# Patient Record
Sex: Male | Born: 1965 | Race: Black or African American | Hispanic: No | Marital: Married | Smoking: Never smoker
Health system: Southern US, Community
[De-identification: ages and names within clinical notes are randomized; demographics above are authoritative.]

## PROBLEM LIST (undated history)

## (undated) DIAGNOSIS — I10 Essential (primary) hypertension: Secondary | ICD-10-CM

## (undated) DIAGNOSIS — I82409 Acute embolism and thrombosis of unspecified deep veins of unspecified lower extremity: Secondary | ICD-10-CM

## (undated) HISTORY — PX: ANKLE SURGERY: SHX546

## (undated) HISTORY — PX: ORIF PROXIMAL TIBIOFIBULAR JOINT: SUR954

## (undated) HISTORY — DX: Essential (primary) hypertension: I10

---

## 1988-08-22 DIAGNOSIS — K259 Gastric ulcer, unspecified as acute or chronic, without hemorrhage or perforation: Secondary | ICD-10-CM

## 1988-08-22 HISTORY — DX: Gastric ulcer, unspecified as acute or chronic, without hemorrhage or perforation: K25.9

## 1997-08-22 HISTORY — PX: OTHER SURGICAL HISTORY: SHX169

## 2004-10-28 ENCOUNTER — Inpatient Hospital Stay: Payer: Self-pay | Admitting: Surgery

## 2006-02-13 ENCOUNTER — Emergency Department: Payer: Self-pay | Admitting: Emergency Medicine

## 2006-02-17 ENCOUNTER — Emergency Department: Payer: Self-pay | Admitting: Emergency Medicine

## 2006-02-20 ENCOUNTER — Emergency Department: Payer: Self-pay | Admitting: Emergency Medicine

## 2006-03-01 ENCOUNTER — Ambulatory Visit: Payer: Self-pay | Admitting: Gastroenterology

## 2008-01-22 ENCOUNTER — Other Ambulatory Visit: Payer: Self-pay

## 2008-01-22 ENCOUNTER — Emergency Department: Payer: Self-pay | Admitting: Emergency Medicine

## 2008-04-24 ENCOUNTER — Emergency Department: Payer: Self-pay | Admitting: Emergency Medicine

## 2008-05-27 ENCOUNTER — Emergency Department: Payer: Self-pay | Admitting: Emergency Medicine

## 2009-02-20 ENCOUNTER — Emergency Department: Payer: Self-pay | Admitting: Emergency Medicine

## 2009-03-30 ENCOUNTER — Emergency Department: Payer: Self-pay | Admitting: Emergency Medicine

## 2009-08-23 ENCOUNTER — Emergency Department: Payer: Self-pay | Admitting: Emergency Medicine
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
## 2009-09-06 IMAGING — CR DG KNEE 1-2V*L*
1 series · 2 of 2 positions shown · non-contrast
Comparison: none

REASON FOR EXAM: painful
COMMENTS:

PROCEDURE:     DXR - DXR KNEE LEFT AP AND LATERAL  - March 30, 2009 [DATE]
RESULT:     There is an essentially nondisplaced oblique fracture of the
proximal diaphysis of the left fibula. No other fractures are seen. The knee
joint space is well maintained. The patella is intact.

[Series 1: view not recorded · 0.17mm/px · 2 of 2 slices shown]
[im 1/2]
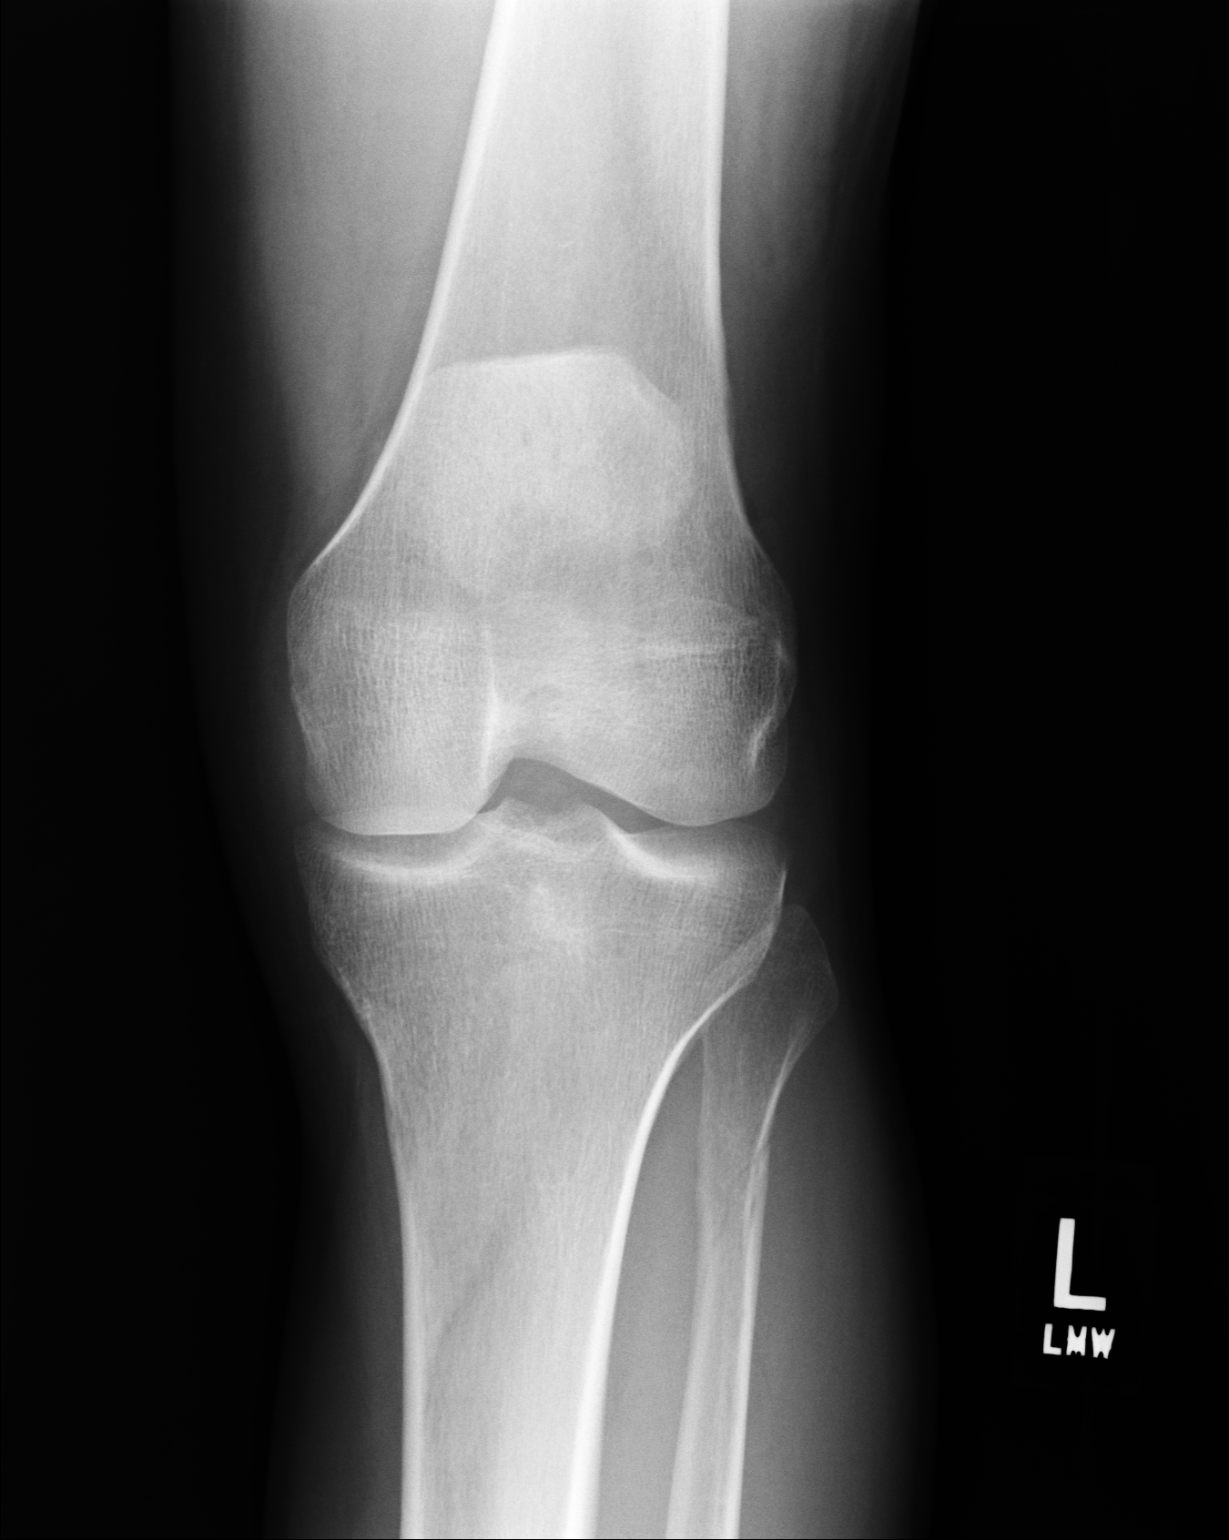
[im 2/2]
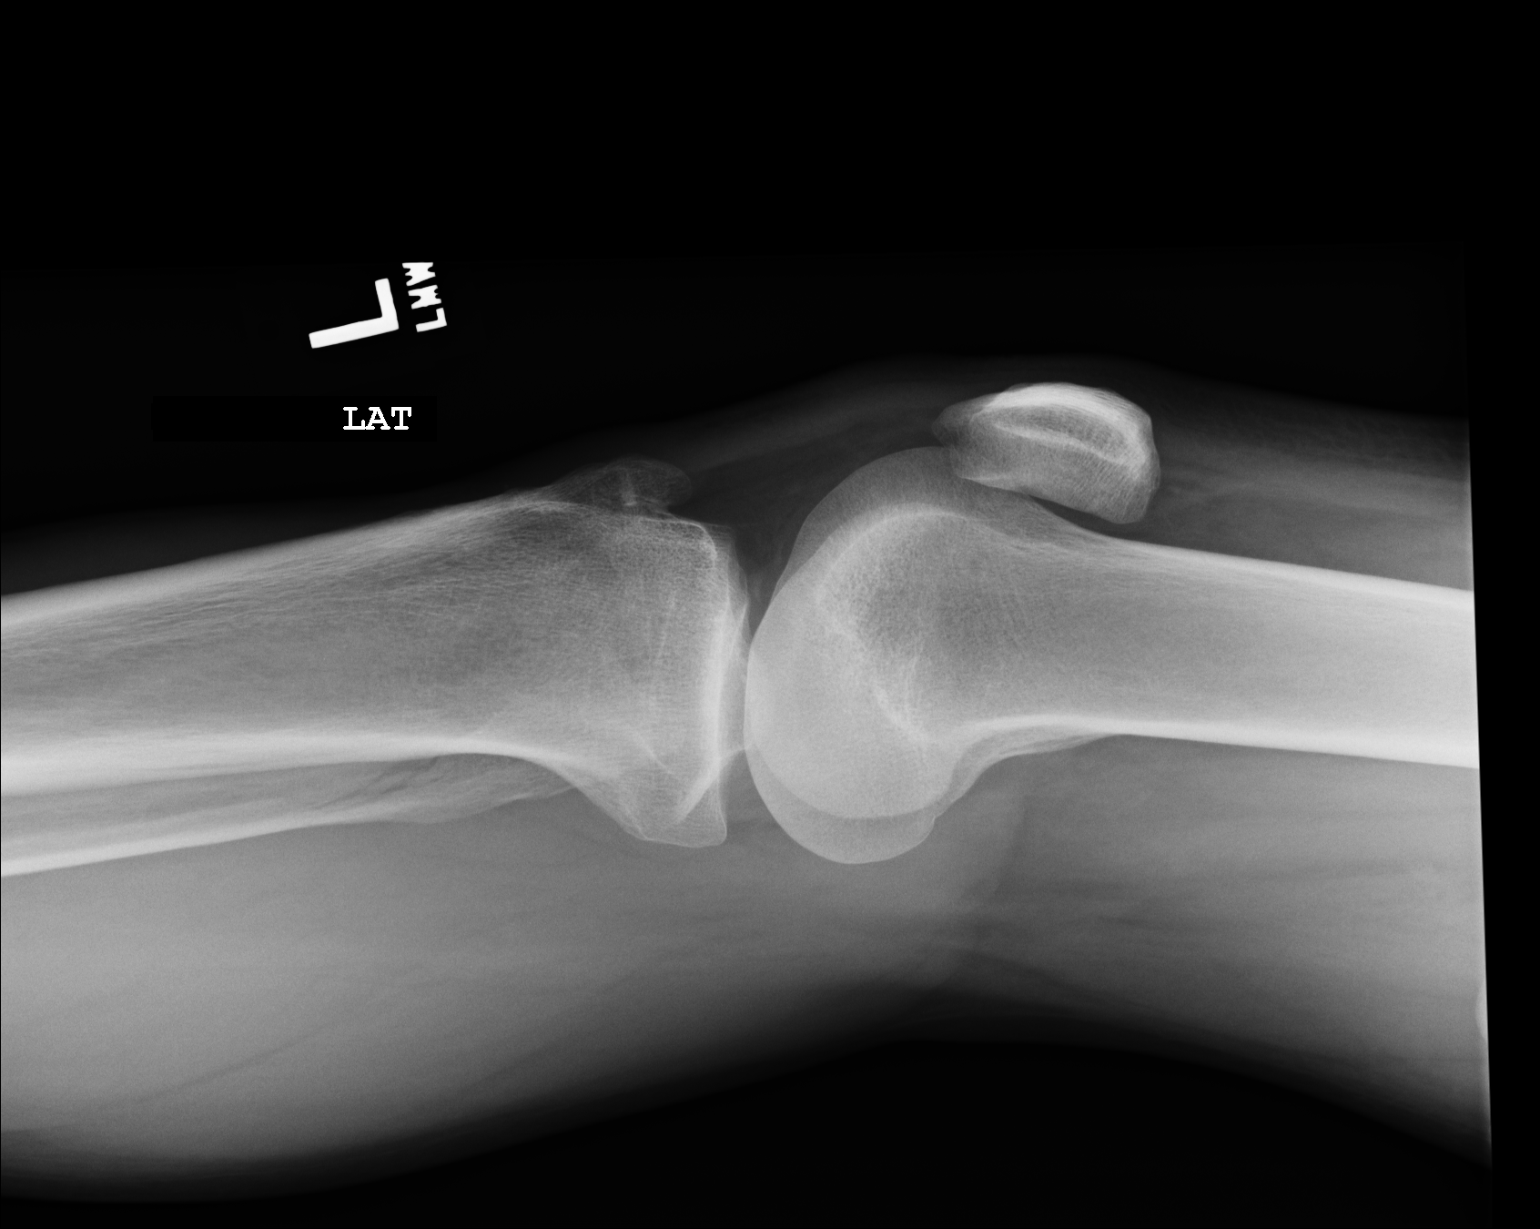

[2 of 2 positions shown; findings below may reference images not displayed]

IMPRESSION: 1.     Fracture of the proximal left fibula, essentially nondisplaced.

## 2009-10-18 ENCOUNTER — Emergency Department: Payer: Self-pay | Admitting: Emergency Medicine

## 2010-03-07 ENCOUNTER — Emergency Department: Payer: Self-pay | Admitting: Emergency Medicine

## 2010-03-10 ENCOUNTER — Inpatient Hospital Stay: Payer: Self-pay | Admitting: Surgery

## 2010-04-05 ENCOUNTER — Other Ambulatory Visit: Payer: Self-pay | Admitting: Specialist

## 2010-04-13 ENCOUNTER — Encounter: Payer: Self-pay | Admitting: Specialist

## 2010-04-22 ENCOUNTER — Encounter: Payer: Self-pay | Admitting: Specialist

## 2010-05-22 ENCOUNTER — Encounter: Payer: Self-pay | Admitting: Specialist

## 2010-06-12 ENCOUNTER — Emergency Department: Payer: Self-pay | Admitting: Internal Medicine

## 2010-06-22 ENCOUNTER — Encounter: Payer: Self-pay | Admitting: Specialist

## 2011-06-04 ENCOUNTER — Emergency Department: Payer: Self-pay | Admitting: Unknown Physician Specialty

## 2011-06-12 ENCOUNTER — Emergency Department: Payer: Self-pay | Admitting: Unknown Physician Specialty

## 2011-09-13 ENCOUNTER — Emergency Department: Payer: Self-pay | Admitting: Emergency Medicine

## 2011-09-13 LAB — BASIC METABOLIC PANEL
Co2: 28 mmol/L (ref 21–32)
Creatinine: 0.88 mg/dL (ref 0.60–1.30)
EGFR (African American): >60
EGFR (Non-African Amer.): >60
Glucose: 84 mg/dL (ref 65–99)
Potassium: 3.7 mmol/L (ref 3.5–5.1)
Sodium: 144 mmol/L (ref 136–145)

## 2011-09-13 LAB — CBC
HCT: 39.1 % — ABNORMAL LOW (ref 40.0–52.0)
HGB: 13.1 g/dL (ref 13.0–18.0)
MCHC: 33.6 g/dL (ref 32.0–36.0)
MCV: 102 fL — ABNORMAL HIGH (ref 80–100)
Platelet: 279 10*3/uL (ref 150–440)
RBC: 3.83 10*6/uL — ABNORMAL LOW (ref 4.40–5.90)
WBC: 8.7 10*3/uL (ref 3.8–10.6)

## 2011-09-13 LAB — TROPONIN I: Troponin-I: <0.02 ng/mL

## 2012-02-18 ENCOUNTER — Emergency Department: Payer: Self-pay | Admitting: *Deleted

## 2012-02-19 ENCOUNTER — Ambulatory Visit: Payer: Self-pay | Admitting: Orthopaedic Surgery

## 2012-02-19 LAB — CBC WITH DIFFERENTIAL/PLATELET
Basophil #: 0 10*3/uL (ref 0.0–0.1)
Eosinophil #: 0 10*3/uL (ref 0.0–0.7)
Eosinophil %: 0.2 %
HCT: 35.7 % — ABNORMAL LOW (ref 40.0–52.0)
Lymphocyte #: 1 10*3/uL (ref 1.0–3.6)
Lymphocyte %: 8.4 %
MCH: 33.6 pg (ref 26.0–34.0)
MCHC: 32.7 g/dL (ref 32.0–36.0)
MCV: 103 fL — ABNORMAL HIGH (ref 80–100)
Monocyte #: 0.9 x10 3/mm (ref 0.2–1.0)
Monocyte %: 7.3 %
Platelet: 277 10*3/uL (ref 150–440)
RBC: 3.48 10*6/uL — ABNORMAL LOW (ref 4.40–5.90)
RDW: 12.6 % (ref 11.5–14.5)
WBC: 12.4 10*3/uL — ABNORMAL HIGH (ref 3.8–10.6)

## 2012-02-19 LAB — COMPREHENSIVE METABOLIC PANEL
Anion Gap: 12 (ref 7–16)
Bilirubin,Total: 0.4 mg/dL (ref 0.2–1.0)
Calcium, Total: 9 mg/dL (ref 8.5–10.1)
Chloride: 102 mmol/L (ref 98–107)
Co2: 24 mmol/L (ref 21–32)
EGFR (African American): >60
EGFR (Non-African Amer.): >60
Potassium: 3.4 mmol/L — ABNORMAL LOW (ref 3.5–5.1)
SGOT(AST): 46 U/L — ABNORMAL HIGH (ref 15–37)
Total Protein: 7.9 g/dL (ref 6.4–8.2)

## 2012-04-22 ENCOUNTER — Encounter: Payer: Self-pay | Admitting: Orthopedic Surgery

## 2012-06-29 ENCOUNTER — Emergency Department: Payer: Self-pay | Admitting: *Deleted

## 2012-07-17 ENCOUNTER — Emergency Department: Payer: Self-pay | Admitting: Emergency Medicine

## 2012-09-20 ENCOUNTER — Encounter: Payer: Self-pay | Admitting: Orthopedic Surgery

## 2012-09-22 ENCOUNTER — Encounter: Payer: Self-pay | Admitting: Orthopedic Surgery

## 2012-10-06 LAB — BASIC METABOLIC PANEL
BUN: 6 mg/dL — ABNORMAL LOW (ref 7–18)
Calcium, Total: 9.5 mg/dL (ref 8.5–10.1)
Co2: 26 mmol/L (ref 21–32)
EGFR (African American): >60
Glucose: 102 mg/dL — ABNORMAL HIGH (ref 65–99)
Sodium: 138 mmol/L (ref 136–145)

## 2012-10-06 LAB — CBC
HCT: 37.1 % — ABNORMAL LOW (ref 40.0–52.0)
HGB: 11.6 g/dL — ABNORMAL LOW (ref 13.0–18.0)
MCH: 28 pg (ref 26.0–34.0)
MCHC: 31.3 g/dL — ABNORMAL LOW (ref 32.0–36.0)
MCV: 89 fL (ref 80–100)
Platelet: 286 10*3/uL (ref 150–440)
RBC: 4.16 10*6/uL — ABNORMAL LOW (ref 4.40–5.90)

## 2012-10-07 ENCOUNTER — Inpatient Hospital Stay: Payer: Self-pay | Admitting: Internal Medicine

## 2012-10-07 LAB — BASIC METABOLIC PANEL
Anion Gap: 9 (ref 7–16)
Calcium, Total: 8.8 mg/dL (ref 8.5–10.1)
Chloride: 104 mmol/L (ref 98–107)
Co2: 23 mmol/L (ref 21–32)
Creatinine: 0.89 mg/dL (ref 0.60–1.30)
EGFR (African American): >60
EGFR (Non-African Amer.): >60
Glucose: 95 mg/dL (ref 65–99)
Potassium: 3.6 mmol/L (ref 3.5–5.1)

## 2012-10-07 LAB — CBC WITH DIFFERENTIAL/PLATELET
Basophil #: 0.1 10*3/uL (ref 0.0–0.1)
Eosinophil %: 2.8 %
HCT: 32.9 % — ABNORMAL LOW (ref 40.0–52.0)
HGB: 10.5 g/dL — ABNORMAL LOW (ref 13.0–18.0)
MCH: 28.1 pg (ref 26.0–34.0)
MCHC: 32 g/dL (ref 32.0–36.0)
Monocyte %: 11.9 %
Neutrophil #: 6.6 10*3/uL — ABNORMAL HIGH (ref 1.4–6.5)
RBC: 3.74 10*6/uL — ABNORMAL LOW (ref 4.40–5.90)

## 2012-10-07 LAB — HEPATIC FUNCTION PANEL A (ARMC)
Albumin: 4.4 g/dL (ref 3.4–5.0)
Alkaline Phosphatase: 131 U/L (ref 50–136)
Bilirubin,Total: 0.4 mg/dL (ref 0.2–1.0)
SGOT(AST): 51 U/L — ABNORMAL HIGH (ref 15–37)
SGPT (ALT): 38 U/L (ref 12–78)
Total Protein: 7.7 g/dL (ref 6.4–8.2)

## 2012-10-07 LAB — PROTIME-INR: INR: 0.9

## 2012-10-08 LAB — CBC WITH DIFFERENTIAL/PLATELET
Basophil #: 0 10*3/uL (ref 0.0–0.1)
Basophil %: 0.7 %
Eosinophil #: 0.2 10*3/uL (ref 0.0–0.7)
HGB: 9.9 g/dL — ABNORMAL LOW (ref 13.0–18.0)
Lymphocyte %: 18.7 %
MCH: 28 pg (ref 26.0–34.0)
MCHC: 31.8 g/dL — ABNORMAL LOW (ref 32.0–36.0)
MCV: 88 fL (ref 80–100)
Monocyte #: 1.1 x10 3/mm — ABNORMAL HIGH (ref 0.2–1.0)
Monocyte %: 15.1 %
Neutrophil #: 4.4 10*3/uL (ref 1.4–6.5)
Neutrophil %: 62.3 %
Platelet: 224 10*3/uL (ref 150–440)
RBC: 3.55 10*6/uL — ABNORMAL LOW (ref 4.40–5.90)
RDW: 18.5 % — ABNORMAL HIGH (ref 11.5–14.5)

## 2012-10-09 LAB — CBC WITH DIFFERENTIAL/PLATELET
Eosinophil %: 2.7 %
HGB: 10.4 g/dL — ABNORMAL LOW (ref 13.0–18.0)
Lymphocyte %: 16 %
MCH: 28.6 pg (ref 26.0–34.0)
MCHC: 32.2 g/dL (ref 32.0–36.0)
MCV: 89 fL (ref 80–100)
Monocyte %: 14.9 %
Neutrophil #: 5.9 10*3/uL (ref 1.4–6.5)
Neutrophil %: 65.8 %
RDW: 18 % — ABNORMAL HIGH (ref 11.5–14.5)
WBC: 9 10*3/uL (ref 3.8–10.6)

## 2012-10-20 ENCOUNTER — Encounter: Payer: Self-pay | Admitting: Orthopedic Surgery

## 2012-11-20 ENCOUNTER — Encounter: Payer: Self-pay | Admitting: Orthopedic Surgery

## 2012-12-20 ENCOUNTER — Encounter: Payer: Self-pay | Admitting: Orthopedic Surgery

## 2013-06-03 ENCOUNTER — Emergency Department: Payer: Self-pay | Admitting: Emergency Medicine

## 2013-06-03 LAB — BASIC METABOLIC PANEL
BUN: 5 mg/dL — ABNORMAL LOW (ref 7–18)
Calcium, Total: 9.9 mg/dL (ref 8.5–10.1)
Co2: 26 mmol/L (ref 21–32)
Creatinine: 0.85 mg/dL (ref 0.60–1.30)
EGFR (African American): >60
Osmolality: 272 (ref 275–301)
Potassium: 4.1 mmol/L (ref 3.5–5.1)
Sodium: 138 mmol/L (ref 136–145)

## 2013-06-03 LAB — CBC
MCH: 32.6 pg (ref 26.0–34.0)
MCHC: 33.7 g/dL (ref 32.0–36.0)
MCV: 97 fL (ref 80–100)
Platelet: 252 10*3/uL (ref 150–440)
RBC: 3.95 10*6/uL — ABNORMAL LOW (ref 4.40–5.90)
WBC: 8.4 10*3/uL (ref 3.8–10.6)

## 2014-12-12 NOTE — Consult Note (Signed)
Chief Complaint:  Subjective/Chief Complaint No signs of active GI bleed. H and H stable. EGD with clean based ulcer.  Recommendations: May DC Octreotide in am if no signs of active bleeding. Probable DC in 1-2 days on PO PPI. Follow up with GI in 3-4 weeks.   Electronic Signatures: Lurline DelIftikhar, Latessa Tillis (MD)  (Signed [id number]-Feb-14 23:06)  Authored: Chief Complaint   Last Updated: 17-Feb-14 23:06 by Lurline DelIftikhar, Faruq Rosenberger (MD)

## 2014-12-12 NOTE — Consult Note (Signed)
PATIENT NAME:  Ricardo Goodman, Ricardo Goodman MR#:  [id number] DATE OF BIRTH:  28-Jun-1966  DATE OF CONSULTATION:  10/07/2012  REFERRING PHYSICIAN:   CONSULTING PHYSICIAN:  Lurline DelShaukat Marybell Robards, MD  REASON FOR CONSULTATION: Hematochezia, hematemesis.   HISTORY OF PRESENT ILLNESS: This is a 49 year old African American male with a history of ruptured peptic ulcer disease about 10 years ago according to him.  The patient presented to the Emergency Room on the day of admission with epigastric abdominal pain. According to him, he threw up blood at home 2 or 3 times and has been passing a small amount of bright red blood per rectum as well. On arrival, the patient was hemodynamically stable. Hemoglobin was about 11.6.  He denies melena. No other significant symptoms such as dizziness, weakness, syncope, presyncope, etc. was reported by the patient.   PAST MEDICAL HISTORY: Significant for history of ruptured peptic ulcer disease that required surgery.   ALLERGIES: None.   HOME MEDICATIONS:  Tramadol. He does take some ibuprofen every 8 hours as needed.   SOCIAL HISTORY: Drinks beer occasionally.  He smokes a few cigarettes a day.   REVIEW OF SYSTEMS:  Grossly negative except for what is mentioned in the History of Present Illness.   PHYSICAL EXAMINATION: GENERAL: Well built male who did not appear to be in any acute distress. No tachycardia or hypotension was noted.  NECK: Neck veins are flat.  LUNGS: Grossly clear to auscultation bilaterally with fair air entry and no added sounds.  CARDIOVASCULAR: Regular rate and rhythm. No gallops or murmur.  ABDOMEN: Examination showed moderate epigastric tenderness. No rebound or guarding was noted. No hepatosplenomegaly or ascites.  NEUROLOGIC: Appears to be unremarkable.   LABORATORY AND DIAGNOSTIC DATA:  Hemoglobin on admission was 11.6 which slowly drifted down to 10.4 in 48 hours or so.  PT and INR are within normal limits. Serum lipase is normal. Liver enzymes are  unremarkable as well as electrolytes, BUN and creatinine. CT scan of the abdomen and pelvis with contrast:  No significant abnormalities.   ASSESSMENT AND PLAN: The patient is with what appears to be upper gastrointestinal bleed as evidenced by hematemesis and epigastric pain. The patient has history of peptic ulcer disease in the past and has been using nonsteroidals, raising concerns about possible nonsteroidal-induced gastric mucosal damage. An upper gastrointestinal endoscopy was recommended and performed; full report is in the chart. The plan was discussed with admitting physician. The patient was treated with intravenous octreotide and pantoprazole initially, octreotide  was later discontinued and pantoprazole was switched to p.o. The patient will follow with us in a few weeks as an outpatient.  Biopsies were obtained during esophagogastroduodenoscopy for Helicobacter pylori and results were communicated with the patient once available.      ____________________________ Lurline DelShaukat Tymeir Weathington, MD si:ct D: 10/10/2012 09:00:47 ET T: 10/10/2012 09:23:28 ET JOB#: [id number]  cc: Lurline DelShaukat Brandt Chaney, MD, <Dictator> Lurline DelSHAUKAT Cannen Dupras MD ELECTRONICALLY SIGNED 10/18/2012 12:14

## 2014-12-12 NOTE — Consult Note (Signed)
Brief Consult Note: Diagnosis: Hematemesis and mlena/hematochezia.   Patient was seen by consultant.   Comments: Patient with 2 days h/o hematemesis as well as some rectal bleeding, not clear if the two are connected or the lower GI bleed is hemorrhoidal. H and H stable and he is hemodynamically stable.History of ruptured peptic ulcer disease many years ago.  Recommendations: Agree with current management. EGD. Follow H and H and transfuse as needed.  Electronic Signatures: Lurline DelIftikhar, Gao Mitnick (MD)  (Signed 16-Feb-14 10:43)  Authored: Brief Consult Note   Last Updated: 16-Feb-14 10:43 by Lurline DelIftikhar, Mieczyslaw Stamas (MD)

## 2014-12-12 NOTE — H&P (Signed)
PATIENT NAME:  Ricardo Goodman, Zaeem E MR#:  [id number] DATE OF BIRTH:  Jan 03, 1966  DATE OF ADMISSION:  10/07/2012  PRIMARY CARE PHYSICIAN:  Toy CookeyErnest Eason, MD  REFERRING PHYSICIAN:  Chiquita LothJade Sung, MD  CHIEF COMPLAINT:  Hematemesis, lower GI bleed and abdominal pain.   HISTORY OF PRESENT ILLNESS: The patient is a 49 year old African American male with a past medical history of peptic ulcer disease in the past, status post upper GI endoscopy and colonoscopy by Dr. Servando SnareWohl in the year 2007, status post peptic ulcer rupture approximately 10 years ago.  He is presenting to the ER with a chief complaint of epigastric abdominal pain. The patient had 3 episodes of hematemesis in the past 2 days and also he started having lower GI bleed. The patient is reporting of epigastric abdominal pain 8 to 9 out of 10. He has been using NSAIDs intermittently as needed for pain. The patient came into the ER after waiting for 2 days as his hematemesis is not improving. He admits that he sniffs cocaine. In the ER the patient had bright red blood bleed from the lower GI tract. The patient is not hypotensive or tachycardic.  A CT scan of the abdomen and pelvis is ordered, which is pending. His hemoglobin 11.6, hematocrit 37.1, platelet count 286.  He denies any chest pain or shortness of breath. No other complaints. No similar complaints in the recent past.  PAST MEDICAL HISTORY: Peptic ulcer disease.   PAST SURGICAL HISTORY: Left leg surgery for fractures of tibia and fibula.   ALLERGIES: The patient has no known drug allergies.   HOME MEDICATIONS: Tramadol as needed, Percocet 5/325 1 tablet p.o. q.6 hours as needed, ibuprofen 800 mg every 8 hours as needed.   PSYCHOSOCIAL HISTORY: Lives at home.  Drinks beer occasionally, smokes a few cigarettes per day and sniffs cocaine.   FAMILY HISTORY: Mother had a history of breast cancer.    REVIEW OF SYSTEMS:  CONSTITUTIONAL: Denies any fatigue, weakness, weight loss or weight gain.   EYES: Denies blurry vision, glaucoma, cataracts.  EARS, NOSE, THROAT: Denies epistaxis, discharge, snoring.  RESPIRATORY: No cough, no COPD or pneumonia.  CARDIOVASCULAR: Denies chest pain, palpitations, syncope.  GASTROINTESTINAL: Denies nausea, but vomiting blood, passing bright red blood per rectum. Complaining of epigastric abdominal pain. Denies jaundice.  Positive history of peptic ulcer disease.  GENITOURINARY: Denies any dysuria or hematuria.  ENDOCRINOLOGY: Denies polyuria, polyphagia or thyroid problems.  HEMATOLOGIC/LYMPHATIC:  Denies anemia or swollen glands.  INTEGUMENTARY: No acne, rash, lesions.  MUSCULOSKELETAL: No neck pain, back pain, shoulder pain. NEUROLOGIC:  Denies vertigo, ataxia, dementia.  PSYCHIATRIC: No history of insomnia, ADD, OCD.   PHYSICAL EXAMINATION: VITAL SIGNS: Temperature 98.1, pulse of 74, respirations 20, blood pressure 144/85, pulse oximetry 96% on room air.  GENERAL APPEARANCE: Not in acute distress, moderately built and moderately nourished, answering questions appropriately.  HEENT: Normocephalic, atraumatic. Pupils are equally reacting to light and accommodation. No scleral icterus. No postnasal drip. No sinus tenderness.  NECK: Supple. No JVD. No thyromegaly.  LUNGS: Clear to auscultation bilaterally. No accessory muscle usage. No anterior chest wall tenderness.  CARDIAC: S1, S2 normal. Regular rate and rhythm. No murmurs.  GASTROINTESTINAL: Soft, positive epigastric tenderness. No masses felt. No rebound tenderness. No CVA tenderness.  NEUROLOGIC: Awake, alert, oriented x 3. Motor and sensory are grossly intact. Reflexes are 2+.  SKIN: No lesions. No rashes, normal turgor, warm to touch.  EXTREMITIES: No edema. No cyanosis. No clubbing.  MUSCULOSKELETAL: No joint effusion,  tenderness or erythema.  RECTAL:  The patient deferred exam.   LABORATORY AND DIAGNOSTIC DATA:  CAT scan of the abdomen and pelvis is pending. Glucose 102, BUN 6,  creatinine 1.36, sodium 138, potassium 3.9, chloride 105, CO2 26, anion gap is 7, GFR greater than 60, serum osmolality 273, lipase 151, calcium 9.5.  LFTs are  within normal range except AST which is slightly elevated at 51. Troponin T less than 0.72. WBC 9.6, hemoglobin 11.6, hematocrit 37.1, platelet count 286. MCV 89. CT abdomen and pelvis is ordered, which is pending.   ASSESSMENT AND PLAN: A 49 year old male presenting to the Emergency Room with a 2-day history of hematemesis and lower gastrointestinal bleed with blood associated with epigastric abdominal pain.  He will be admitted with the following assessment and plan:  1.  Both upper and lower gastrointestinal bleed with epigastric abdominal pain. Plan:  We will keep him n.p.o. and provide him intravenous fluids. We will admit him to intensive care unit.  We will provide him with Protonix GTT.  We will start him on octreotide as recommended by gastroenterology doctor, Dr. Niel Hummer. We will check hemoglobin and hematocrit q.6 hours. Will check PT INRs. Consult is placed to gastroenterologist Dr. Niel Hummer. Pain management with morphine.  2.  History of peptic ulcer disease, status post ruptured ulcer.  Also the patient is taking non-steroidal anti-inflammatories 800 mg on an as needed basis for pain which could be the  etiology for the etiology of hematemesis, but we will have to wait on the  CAT scan of the abdomen and pelvis regarding the bright red blood per rectum. 3.  Cocaine use. The patient was counseled to stop taking. 4.  Acute kidney injury.  The patient is on intravenous fluids and we will monitor renal function closely.  5.  Nicotine use. The patient was counseled to quit smoking.  6.  Gastrointestinal prophylaxis with Protonix. and deep venous thrombosis prophylaxis with sequential compression devices only.  CODE STATUS:  Full code.  Critical care time spent:  60 minutes.   The diagnosis and plan of care was discussed in detail  with the patient.   He is aware of the plan.    ____________________________ Ramonita Lab, MD ag:ct D: 10/07/2012 01:22:43 ET T: 10/07/2012 09:02:48 ET JOB#: [id number]  cc: Ramonita Lab, MD, <Dictator> Serita Sheller. Maryellen Pile, MD Lurline Del, MD Ramonita Lab MD ELECTRONICALLY SIGNED 10/09/2012 6:34

## 2014-12-12 NOTE — Discharge Summary (Signed)
PATIENT NAME:  Ricardo Goodman, Ricardo Goodman MR#:  [id number] DATE OF BIRTH:  January 28, 1966  DATE OF ADMISSION:  10/07/2012 DATE OF DISCHARGE:  10/09/2012  FINAL DIAGNOSIS: Upper GI bleed, known bleeding gastric and duodenal ulcer, acute renal failure due to dehydration, improved.   PRIMARY GASTROENTEROLOGIST: Dr. Lurline DelShaukat Iftikhar.  PRIMARY CARE PHYSICIAN: Dr. Toy CookeyErnest Eason.   HISTORY OF PRESENT ILLNESS: The patient is a 49 year old African American male with the past medical history of peptic ulcer disease status post upper GI endoscopy and colonoscopy by Dr. Daleen SquibbWall in 2007, status post peptic ulcer rupture approximately 10 years ago, presented to the ER with chief complaint of epigastric abdominal pain, 3 episodes of hematemesis in the past 2 days and also had lower GI bleed. The patient is reporting of epigastric abdominal pain 8 to 9/10 and has been using NSAIDS immediately as needed for pain, came in the ER, after waiting for 2 days as his hematemsis is not improving. He admit that he sniffs cocaine. In the ER he had bright red blood from lower GI tract, but he was not hypotensive or tachycardic on presentation. CT scan of the abdomen and pelvis was ordered, and hemoglobin was 11.6.   ASSESSMENT AND PLAN:  Kept him n.p.o., provided him IV fluids and admitted to the Intensive Care Unit, was also started on Protonix drip and octreotide drip as recommended by  Dr. Niel HummerIftikhar. Hemoglobin was checked every 6 hours. Gastroenterology consult was done and the next day they did the endoscopy.   The findings were as follows: Normal esophagus, nonbleeding  gastropathy, one duodenal ulcer with clean base. After this gastroenterologist suggested to stop octreotide drip and start liquid diet slowly. The patient tolerated the diet well, and the next day he was feeling fine. His hemoglobin level remained stable in the hospital. He presented with 11.6, a drop down to 10.5 and then remained stable at that level. As he tolerated regular  diet, he was discharged home with advice to follow-up with gastroenterologist.     OTHER MEDICAL ISSUES ADDRESSED DURING THE HOSPITAL STAY:  1. History of peptic ulcer disease and ruptured ulcer. He was taking NSAIDS. He was advised to stop taking NSAIDS.  2. Tobacco use disorder and was advised to stop smoking.  3. Acute renal failure on presentation. His creatinine level was 1.36, which came down to 0.89 and so he was discharged home without any further intervention about that issue.  IMPORTANT LAB RESULTS: Creatinine 1.36 on admission came down to 0.89. Troponin less than 0.02. Hemoglobin on presentation 11.6, came down to 10.4 on discharge, remained stable at that level.   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 pylori seen, no dysplasia or malignancy.   CONDITION ON DISCHARGE: Stable.   CODE STATUS ON DISCHARGE: FULL CODE   MEDICATION ON DISCHARGE: MS Contin 50 mg every 12 hours tablet extended-release 2 times a day for 7 days, Protonix 40 mg delayed-release tablet 2 times a day, ferrous sulfate 325 mg oral tablet 3 times a day.   DIET ON DISCHARGE: Regular.   DIET CONSISTENCY: Regular.   DIET SPECIAL INSTRUCTIONS: Less spicy diet.   ACTIVITY: As tolerated.   TIMEFRAME TO FOLLOWUP: Within 1 to 2 weeks with GI Clinic with Dr. Lurline DelShaukat Iftikhar.   TOTAL TIME SPENT ON THIS DISCHARGE: 45 minutes.   ____________________________ Hope PigeonVaibhavkumar G. Elisabeth PigeonVachhani, MD vgv:jm D: 10/12/2012 20:33:00 ET T: 10/13/2012  13:07:05 ET JOB#: [id number]  cc: Hope Pigeon. Elisabeth Pigeon, MD, <Dictator> Lurline Del, MD Serita Sheller. Maryellen Pile, MD Altamese Dilling MD ELECTRONICALLY SIGNED 10/14/2012 23:19
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
## 2015-01-06 ENCOUNTER — Emergency Department
Admission: EM | Admit: 2015-01-06 | Discharge: 2015-01-06 | Disposition: A | Discharge status: Home / Self Care | Payer: No Typology Code available for payment source | Attending: Emergency Medicine | Admitting: Emergency Medicine

## 2015-01-06 DIAGNOSIS — S134XXA Sprain of ligaments of cervical spine, initial encounter: Secondary | ICD-10-CM | POA: Insufficient documentation

## 2015-01-06 DIAGNOSIS — S29012A Strain of muscle and tendon of back wall of thorax, initial encounter: Secondary | ICD-10-CM | POA: Diagnosis not present

## 2015-01-06 DIAGNOSIS — Y92410 Unspecified street and highway as the place of occurrence of the external cause: Secondary | ICD-10-CM | POA: Insufficient documentation

## 2015-01-06 DIAGNOSIS — Z79899 Other long term (current) drug therapy: Secondary | ICD-10-CM | POA: Diagnosis not present

## 2015-01-06 DIAGNOSIS — Z87891 Personal history of nicotine dependence: Secondary | ICD-10-CM | POA: Diagnosis not present

## 2015-01-06 DIAGNOSIS — Y9389 Activity, other specified: Secondary | ICD-10-CM | POA: Insufficient documentation

## 2015-01-06 DIAGNOSIS — S3992XA Unspecified injury of lower back, initial encounter: Secondary | ICD-10-CM | POA: Diagnosis present

## 2015-01-06 DIAGNOSIS — Y998 Other external cause status: Secondary | ICD-10-CM | POA: Diagnosis not present

## 2015-01-06 DIAGNOSIS — S8992XA Unspecified injury of left lower leg, initial encounter: Secondary | ICD-10-CM | POA: Insufficient documentation

## 2015-01-06 DIAGNOSIS — S39012A Strain of muscle, fascia and tendon of lower back, initial encounter: Secondary | ICD-10-CM

## 2015-01-06 MED ORDER — KETOROLAC TROMETHAMINE 10 MG PO TABS: 10.0000 mg | ORAL_TABLET | Freq: Three times a day (TID) | ORAL | Status: DC

## 2015-01-06 MED ORDER — TRAMADOL HCL 50 MG PO TABS
100.0000 mg | ORAL_TABLET | Freq: Once | ORAL | Status: AC
  Administered 2015-01-06: 100 mg via ORAL

## 2015-01-06 MED ORDER — CYCLOBENZAPRINE HCL 5 MG PO TABS: 5.0000 mg | ORAL_TABLET | Freq: Three times a day (TID) | ORAL | Status: DC | PRN

## 2015-01-06 MED ORDER — TRAMADOL HCL 50 MG PO TABS
ORAL_TABLET | ORAL | Status: AC
  Administered 2015-01-06: 100 mg via ORAL
  Filled 2015-01-06: qty 2
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
## 2015-01-12 ENCOUNTER — Encounter: Payer: Self-pay | Admitting: Emergency Medicine

## 2015-01-12 ENCOUNTER — Emergency Department
Admission: EM | Admit: 2015-01-12 | Discharge: 2015-01-12 | Disposition: A | Discharge status: Other Acute Inpatient Hospital | Payer: Medicaid Other | Attending: Emergency Medicine | Admitting: Emergency Medicine

## 2015-01-12 DIAGNOSIS — T2029XA Burn of second degree of multiple sites of head, face, and neck, initial encounter: Secondary | ICD-10-CM | POA: Insufficient documentation

## 2015-01-12 DIAGNOSIS — T22312A Burn of third degree of left forearm, initial encounter: Secondary | ICD-10-CM | POA: Insufficient documentation

## 2015-01-12 DIAGNOSIS — T22012A Burn of unspecified degree of left forearm, initial encounter: Secondary | ICD-10-CM | POA: Diagnosis present

## 2015-01-12 DIAGNOSIS — T23202A Burn of second degree of left hand, unspecified site, initial encounter: Secondary | ICD-10-CM | POA: Diagnosis not present

## 2015-01-12 DIAGNOSIS — Y9389 Activity, other specified: Secondary | ICD-10-CM | POA: Insufficient documentation

## 2015-01-12 DIAGNOSIS — Y998 Other external cause status: Secondary | ICD-10-CM | POA: Insufficient documentation

## 2015-01-12 DIAGNOSIS — T22111A Burn of first degree of right forearm, initial encounter: Secondary | ICD-10-CM | POA: Insufficient documentation

## 2015-01-12 DIAGNOSIS — T3110 Burns involving 10-19% of body surface with 0% to 9% third degree burns: Secondary | ICD-10-CM | POA: Diagnosis not present

## 2015-01-12 DIAGNOSIS — Z79899 Other long term (current) drug therapy: Secondary | ICD-10-CM | POA: Insufficient documentation

## 2015-01-12 DIAGNOSIS — X04XXXA Exposure to ignition of highly flammable material, initial encounter: Secondary | ICD-10-CM | POA: Insufficient documentation

## 2015-01-12 DIAGNOSIS — Y9289 Other specified places as the place of occurrence of the external cause: Secondary | ICD-10-CM | POA: Diagnosis not present

## 2015-01-12 DIAGNOSIS — T2024XA Burn of second degree of nose (septum), initial encounter: Secondary | ICD-10-CM | POA: Diagnosis not present

## 2015-01-12 LAB — CBC WITH DIFFERENTIAL/PLATELET
Basophils Absolute: 0.1 10*3/uL (ref 0–0.1)
Basophils Relative: 1 %
Eosinophils Absolute: 0.2 10*3/uL (ref 0–0.7)
Eosinophils Relative: 3 %
HCT: 32.4 % — ABNORMAL LOW (ref 40.0–52.0)
HEMOGLOBIN: 10.9 g/dL — AB (ref 13.0–18.0)
Lymphocytes Relative: 16 %
Lymphs Abs: 1.4 10*3/uL (ref 1.0–3.6)
MCH: 33.4 pg (ref 26.0–34.0)
MCHC: 33.5 g/dL (ref 32.0–36.0)
MCV: 99.6 fL (ref 80.0–100.0)
MONOS PCT: 15 %
Monocytes Absolute: 1.3 10*3/uL — ABNORMAL HIGH (ref 0.2–1.0)
NEUTROS PCT: 65 %
Neutro Abs: 5.6 10*3/uL (ref 1.4–6.5)
Platelets: 240 10*3/uL (ref 150–440)
RBC: 3.25 MIL/uL — ABNORMAL LOW (ref 4.40–5.90)
RDW: 13.4 % (ref 11.5–14.5)
WBC: 8.6 10*3/uL (ref 3.8–10.6)

## 2015-01-12 LAB — BASIC METABOLIC PANEL
ANION GAP: 11 (ref 5–15)
BUN: 10 mg/dL (ref 6–20)
CALCIUM: 9.7 mg/dL (ref 8.9–10.3)
CO2: 26 mmol/L (ref 22–32)
Chloride: 100 mmol/L — ABNORMAL LOW (ref 101–111)
Creatinine, Ser: 0.94 mg/dL (ref 0.61–1.24)
GFR calc Af Amer: >60 mL/min (ref >60)
Glucose, Bld: 79 mg/dL (ref 65–99)
Potassium: 3.6 mmol/L (ref 3.5–5.1)
Sodium: 137 mmol/L (ref 135–145)

## 2015-01-12 MED ORDER — HYDROMORPHONE HCL 1 MG/ML IJ SOLN
INTRAMUSCULAR | Status: AC
  Administered 2015-01-12: 1 mg via INTRAVENOUS
  Filled 2015-01-12: qty 1

## 2015-01-12 MED ORDER — TETANUS-DIPHTHERIA TOXOIDS TD 5-2 LFU IM INJ
INJECTION | INTRAMUSCULAR | Status: AC
  Administered 2015-01-12: 0.5 mL via INTRAMUSCULAR
  Filled 2015-01-12: qty 0.5

## 2015-01-12 MED ORDER — TETANUS-DIPHTHERIA TOXOIDS TD 5-2 LFU IM INJ
0.5000 mL | INJECTION | Freq: Once | INTRAMUSCULAR | Status: AC
  Administered 2015-01-12: 0.5 mL via INTRAMUSCULAR

## 2015-01-12 MED ORDER — HYDROMORPHONE HCL 1 MG/ML IJ SOLN
1.0000 mg | Freq: Once | INTRAMUSCULAR | Status: AC
  Administered 2015-01-12: 1 mg via INTRAVENOUS

## 2015-01-12 MED ORDER — LACTATED RINGERS IV BOLUS (SEPSIS)
1000.0000 mL | Freq: Once | INTRAVENOUS | Status: AC
  Administered 2015-01-12: 1000 mL via INTRAVENOUS
  Filled 2015-01-12: qty 1000
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
## 2015-01-12 NOTE — ED Notes (Signed)
[facility] Clifton CustardAaron
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
## 2017-07-18 ENCOUNTER — Encounter (HOSPITAL_COMMUNITY): Payer: Self-pay | Admitting: *Deleted

## 2017-07-18 ENCOUNTER — Emergency Department (HOSPITAL_COMMUNITY)

## 2017-07-18 ENCOUNTER — Emergency Department (HOSPITAL_COMMUNITY)
Admission: EM | Admit: 2017-07-18 | Discharge: 2017-07-18 | Disposition: A | Discharge status: Home / Self Care | Attending: Emergency Medicine | Admitting: Emergency Medicine

## 2017-07-18 DIAGNOSIS — Z79899 Other long term (current) drug therapy: Secondary | ICD-10-CM | POA: Diagnosis not present

## 2017-07-18 DIAGNOSIS — W1789XA Other fall from one level to another, initial encounter: Secondary | ICD-10-CM | POA: Diagnosis not present

## 2017-07-18 DIAGNOSIS — M25562 Pain in left knee: Secondary | ICD-10-CM | POA: Diagnosis present

## 2017-07-18 DIAGNOSIS — M25572 Pain in left ankle and joints of left foot: Secondary | ICD-10-CM

## 2017-07-18 HISTORY — DX: Acute embolism and thrombosis of unspecified deep veins of unspecified lower extremity: I82.409

## 2017-07-18 MED ORDER — NAPROXEN 500 MG PO TABS: 500.0000 mg | ORAL_TABLET | Freq: Two times a day (BID) | ORAL | 0 refills | Status: AC

## 2017-07-18 MED ORDER — KETOROLAC TROMETHAMINE 30 MG/ML IJ SOLN
15.0000 mg | Freq: Once | INTRAMUSCULAR | Status: AC
  Administered 2017-07-18: 15 mg via INTRAMUSCULAR
  Filled 2017-07-18: qty 1

## 2017-07-18 MED ORDER — NAPROXEN 250 MG PO TABS
500.0000 mg | ORAL_TABLET | Freq: Once | ORAL | Status: AC
  Administered 2017-07-18: 500 mg via ORAL
  Filled 2017-07-18: qty 2

## 2017-07-18 NOTE — ED Provider Notes (Signed)
[facility] EMERGENCY DEPARTMENT Provider Note   CSN: [id number] Arrival date & time: 07/18/17  2027     History   Chief Complaint Chief Complaint  Patient presents with  . Knee Pain    HPI Rona RavensJames E XXXMitchell is a 51 y.o. male presenting with left knee and ankle pain.  Patient states that just prior to arrival, he jumped from the top bunk onto the floor.  He felt a pop, and had acute onset left knee and ankle pain.  He states he has rods in his lower leg due to a previous injury.  He has not had anything for pain.  He states he is unable to ambulate due to pain.  He denies numbness or tingling.  Ambulation, palpation, and movement makes pain worse, nothing makes it better.  He denies injury elsewhere.  He did not hit his head or lose consciousness.  He is not on blood thinners.  HPI  Past Medical History:  Diagnosis Date  . DVT (deep venous thrombosis) (HCC)     There are no active problems to display for this patient.   Past Surgical History:  Procedure Laterality Date  . ANKLE SURGERY    . ORIF PROXIMAL TIBIOFIBULAR JOINT         Home Medications    Prior to Admission medications   Medication Sig Start Date End Date Taking? Authorizing Provider  cyclobenzaprine (FLEXERIL) 5 MG tablet Take 1 tablet (5 mg total) by mouth 3 (three) times daily as needed for muscle spasms. 01/06/15   Menshew, Charlesetta IvoryJenise V Bacon, PA-C  gabapentin (NEURONTIN) 300 MG capsule Take 300 mg by mouth 3 (three) times daily.    [provider]  ibuprofen (ADVIL,MOTRIN) 100 MG/5ML suspension Take 200 mg by mouth every 4 (four) hours as needed for mild pain.     [provider]  ketorolac (TORADOL) 10 MG tablet Take 1 tablet (10 mg total) by mouth every 8 (eight) hours. 01/06/15   Menshew, Charlesetta IvoryJenise V Bacon, PA-C  naproxen (NAPROSYN) 500 MG tablet Take 1 tablet (500 mg total) by mouth 2 (two) times daily with a meal for 10 days. 07/18/17 07/28/17  Justyna Timoney, PA-C    Family  History History reviewed. No pertinent family history.  Social History Social History   Tobacco Use  . Smoking status: Never Smoker  . Smokeless tobacco: Never Used  Substance Use Topics  . Alcohol use: Yes  . Drug use: No     Allergies   Patient has no known allergies.   Review of Systems Review of Systems  Musculoskeletal: Positive for arthralgias.  Neurological: Negative for numbness.  Hematological: Does not bruise/bleed easily.     Physical Exam Updated Vital Signs BP 122/83 (BP Location: Right Arm)   Pulse 78   Temp 98.2 F (36.8 C) (Oral)   Resp 20   Ht 5\' 8"  (1.727 m)   Wt 81.6 kg (180 lb)   SpO2 100%   BMI 27.37 kg/m   Physical Exam  Constitutional: He is oriented to person, place, and time. He appears well-developed and well-nourished. No distress.  HENT:  Head: Normocephalic and atraumatic.  Eyes: EOM are normal.  Neck: Normal range of motion.  Pulmonary/Chest: Effort normal.  Abdominal: He exhibits no distension.  Musculoskeletal: He exhibits tenderness.  No obvious swelling of the left knee.  Tenderness palpation of entire knee.  No obvious deformity.  Pain with active and passive range of motion.  No obvious swelling of the left ankle.  Tenderness palpation medial and lateral malleoli and surrounding ligaments.  Pedal pulses intact bilaterally.  Color and warmth of feet equal bilaterally.  Sensation intact bilaterally.  Soft compartments.  No tenderness to palpation of the tibia or fibula.   Neurological: He is alert and oriented to person, place, and time. No sensory deficit.  Skin: Skin is warm. No rash noted.  Psychiatric: He has a normal mood and affect.  Nursing note and vitals reviewed.    ED Treatments / Results  Labs (all labs ordered are listed, but only abnormal results are displayed) Labs Reviewed - No data to display  EKG  EKG Interpretation None       Radiology Dg Ankle Complete Left  Result Date: 07/18/2017 CLINICAL  DATA:  51 y/o M; jumped with popping sound and inability to bear weight. EXAM: LEFT ANKLE COMPLETE - 3+ VIEW COMPARISON:  None. FINDINGS: Screw fixation of medial malleolus and plate and screw fixation of the lateral malleolus. Talar dome is intact. Ankle mortise is symmetric on these nonstress views. No acute fracture identified. IMPRESSION: Bimalleolar surgical fixation, no apparent hardware related complication identified. No acute fracture or dislocation. Electronically Signed   By: Mitzi Hansen M.D.   On: 07/18/2017 21:27   Dg Knee Complete 4 Views Left  Result Date: 07/18/2017 CLINICAL DATA:  Left knee pain.  Unable to bear weight. EXAM: LEFT KNEE - COMPLETE 4+ VIEW COMPARISON:  None. FINDINGS: Extensive surgical hardware at the tibial plateau and proximal tibia with plate and screw fixation. Negative for an acute fracture or dislocation. No significant joint effusion. IMPRESSION: Extensive postsurgical changes involving the proximal left tibia. No acute bone abnormality to the left knee. Electronically Signed   By: Richarda Overlie M.D.   On: 07/18/2017 21:23    Procedures Procedures (including critical care time)  Medications Ordered in ED Medications  ketorolac (TORADOL) 30 MG/ML injection 15 mg (15 mg Intramuscular Given 07/18/17 2123)  naproxen (NAPROSYN) tablet 500 mg (500 mg Oral Given 07/18/17 2201)     Initial Impression / Assessment and Plan / ED Course  I have reviewed the triage vital signs and the nursing notes.  Pertinent labs & imaging results that were available during my care of the patient were reviewed by me and considered in my medical decision making (see chart for details).     Patient presenting for evaluation of left ankle and knee pain after jumping off the top bunk.  He is neurovascularly intact.  No obvious injuries elsewhere.  X-rays negative for fracture or dislocation.  Likely muscular ligamentous injuries.  Toradol given for pain.  Patient reports  improvement of pain with medicine.  As patient is going back to jail, there are restrictions as what he can wear.  Will give neoprene sleeve and ASO brace without the laces for tonight.  Patient to be transferred to a different facility tomorrow where he can wear a knee immobilizer.  If symptoms are not improving in 7 days, patient follow-up with orthopedics.  NSAIDs for pain control.  Discussed RICE treatment.  At this time, patient appears safe for discharge.  Return precautions given.  Patient states he understands and agrees to plan.   Final Clinical Impressions(s) / ED Diagnoses   Final diagnoses:  Acute pain of left knee  Acute left ankle pain    ED Discharge Orders        Ordered    naproxen (NAPROSYN) 500 MG tablet  2 times daily with meals  07/18/17 2148       Alveria ApleyCaccavale, Jedadiah Abdallah, PA-C 07/19/17 0145    Donnetta Hutchingook, Brian, MD 07/21/17 2106

## 2017-07-18 NOTE — Discharge Instructions (Signed)
There was no new bony damage, fracture, or dislocation noted on your x-rays.  You likely have muscular or ligamentous injury of your knee and ankle.  This will often heal on its own with time. Take naproxen twice a day with meals for the next 10 days. Try to rest your knee and ankle as much as possible.  Elevate it when it is possible. Wear the knee sleeve tonight.  If you are able to be transferred, you may wear the knee immobilizer.  You should wear this at all times except while showering.  This will help support your knee. If your pain is not improving in 1 week, follow-up with the orthopedic doctor. Return to the emergency room if you develop numbness, color change of your foot, or any new or worsening symptoms.

## 2017-07-18 NOTE — ED Triage Notes (Signed)
Pt jumped from top bunk bed and heard a popping sound landed on floor. C/o left knee and left ankle, unable to put weight on foot.

## 2017-07-18 NOTE — ED Notes (Signed)
Pt to xray

## 2022-10-18 DIAGNOSIS — F3181 Bipolar II disorder: Secondary | ICD-10-CM | POA: Diagnosis not present

## 2022-12-31 DIAGNOSIS — Z6831 Body mass index (BMI) 31.0-31.9, adult: Secondary | ICD-10-CM | POA: Diagnosis not present

## 2022-12-31 DIAGNOSIS — K219 Gastro-esophageal reflux disease without esophagitis: Secondary | ICD-10-CM | POA: Diagnosis not present

## 2022-12-31 DIAGNOSIS — F419 Anxiety disorder, unspecified: Secondary | ICD-10-CM | POA: Diagnosis not present

## 2022-12-31 DIAGNOSIS — E785 Hyperlipidemia, unspecified: Secondary | ICD-10-CM | POA: Diagnosis not present

## 2022-12-31 DIAGNOSIS — Z809 Family history of malignant neoplasm, unspecified: Secondary | ICD-10-CM | POA: Diagnosis not present

## 2022-12-31 DIAGNOSIS — E669 Obesity, unspecified: Secondary | ICD-10-CM | POA: Diagnosis not present

## 2022-12-31 DIAGNOSIS — Z87891 Personal history of nicotine dependence: Secondary | ICD-10-CM | POA: Diagnosis not present

## 2022-12-31 DIAGNOSIS — I10 Essential (primary) hypertension: Secondary | ICD-10-CM | POA: Diagnosis not present

## 2022-12-31 DIAGNOSIS — M1990 Unspecified osteoarthritis, unspecified site: Secondary | ICD-10-CM | POA: Diagnosis not present

## 2022-12-31 DIAGNOSIS — F329 Major depressive disorder, single episode, unspecified: Secondary | ICD-10-CM | POA: Diagnosis not present

## 2023-01-28 ENCOUNTER — Emergency Department: Payer: Medicaid Other

## 2023-01-28 ENCOUNTER — Encounter: Payer: Self-pay | Admitting: Emergency Medicine

## 2023-01-28 ENCOUNTER — Other Ambulatory Visit: Payer: Self-pay

## 2023-01-28 DIAGNOSIS — K625 Hemorrhage of anus and rectum: Secondary | ICD-10-CM | POA: Insufficient documentation

## 2023-01-28 DIAGNOSIS — I10 Essential (primary) hypertension: Secondary | ICD-10-CM | POA: Diagnosis not present

## 2023-01-28 DIAGNOSIS — R0789 Other chest pain: Secondary | ICD-10-CM | POA: Diagnosis not present

## 2023-01-28 DIAGNOSIS — R079 Chest pain, unspecified: Secondary | ICD-10-CM | POA: Diagnosis present

## 2023-01-28 LAB — COMPREHENSIVE METABOLIC PANEL
ALT: 61 U/L — ABNORMAL HIGH (ref 0–44)
AST: 93 U/L — ABNORMAL HIGH (ref 15–41)
Albumin: 4.6 g/dL (ref 3.5–5.0)
Alkaline Phosphatase: 85 U/L (ref 38–126)
Anion gap: 13 (ref 5–15)
BUN: 10 mg/dL (ref 6–20)
CO2: 18 mmol/L — ABNORMAL LOW (ref 22–32)
Calcium: 9.5 mg/dL (ref 8.9–10.3)
Chloride: 110 mmol/L (ref 98–111)
Creatinine, Ser: 1.01 mg/dL (ref 0.61–1.24)
GFR, Estimated: >60 mL/min (ref >60)
Glucose, Bld: 139 mg/dL — ABNORMAL HIGH (ref 70–99)
Potassium: 3.6 mmol/L (ref 3.5–5.1)
Sodium: 141 mmol/L (ref 135–145)
Total Bilirubin: 0.7 mg/dL (ref 0.3–1.2)
Total Protein: 7.5 g/dL (ref 6.5–8.1)

## 2023-01-28 LAB — TROPONIN I (HIGH SENSITIVITY): Troponin I (High Sensitivity): 10 ng/L (ref <18)

## 2023-01-28 LAB — CBC WITH DIFFERENTIAL/PLATELET
Abs Immature Granulocytes: 0.02 10*3/uL (ref 0.00–0.07)
Basophils Absolute: 0.1 10*3/uL (ref 0.0–0.1)
Basophils Relative: 1 %
Eosinophils Absolute: 0.2 10*3/uL (ref 0.0–0.5)
Eosinophils Relative: 3 %
HCT: 39.3 % (ref 39.0–52.0)
Hemoglobin: 13.2 g/dL (ref 13.0–17.0)
Immature Granulocytes: 0 %
Lymphocytes Relative: 32 %
Lymphs Abs: 2.4 10*3/uL (ref 0.7–4.0)
MCH: 33.5 pg (ref 26.0–34.0)
MCHC: 33.6 g/dL (ref 30.0–36.0)
MCV: 99.7 fL (ref 80.0–100.0)
Monocytes Absolute: 0.8 10*3/uL (ref 0.1–1.0)
Monocytes Relative: 11 %
Neutro Abs: 3.9 10*3/uL (ref 1.7–7.7)
Neutrophils Relative %: 53 %
Platelets: 234 10*3/uL (ref 150–400)
RBC: 3.94 MIL/uL — ABNORMAL LOW (ref 4.22–5.81)
RDW: 12.5 % (ref 11.5–15.5)
WBC: 7.3 10*3/uL (ref 4.0–10.5)
nRBC: 0 % (ref 0.0–0.2)

## 2023-01-28 NOTE — ED Triage Notes (Signed)
Pt presents ambulatory to triage via POV with complaints of L sided CP that started 2 days with associated headache. Rate the pain 8/10- no meds taken PTA. A&Ox4 at this time. Denies cough, fevers, chills, or SOB.

## 2023-01-29 ENCOUNTER — Emergency Department
Admission: EM | Admit: 2023-01-29 | Discharge: 2023-01-29 | Disposition: A | Discharge status: Home / Self Care | Payer: Medicaid Other | Attending: Emergency Medicine | Admitting: Emergency Medicine

## 2023-01-29 DIAGNOSIS — R079 Chest pain, unspecified: Secondary | ICD-10-CM

## 2023-01-29 DIAGNOSIS — I10 Essential (primary) hypertension: Secondary | ICD-10-CM | POA: Diagnosis not present

## 2023-01-29 DIAGNOSIS — K625 Hemorrhage of anus and rectum: Secondary | ICD-10-CM

## 2023-01-29 LAB — D-DIMER, QUANTITATIVE: D-Dimer, Quant: 0.28 ug/mL-FEU (ref 0.00–0.50)

## 2023-01-29 LAB — TROPONIN I (HIGH SENSITIVITY): Troponin I (High Sensitivity): 7 ng/L (ref <18)

## 2023-01-29 MED ORDER — AMLODIPINE BESYLATE 5 MG PO TABS: 5.0000 mg | ORAL_TABLET | Freq: Every day | ORAL | 2 refills | Status: AC

## 2023-01-29 NOTE — ED Provider Notes (Signed)
[facility] Provider Note    Event Date/Time   First MD Initiated Contact with Patient 01/29/23 0202     (approximate)   History   Chest Pain   HPI  Ricardo Goodman is a 57 y.o. male with a history of PUD and a remote history of DVT, not on anticoagulation, who presents with chest pain for the last 2 days, mainly in the lower part of the center of his chest, nonradiating, and associated with some shortness of breath.  The patient also reports lightheadedness over the last week which she associates with elevated blood pressure.  He states his blood pressures have been around the 160s systolic.  He is not currently on any blood pressure medication.  The patient reports some shortness of breath intermittently associated with the chest pain.  The pain is worse with exertion and improved with rest.  He denies any cough or fever.  He has no vomiting or diarrhea.  However the patient also reports bright red blood in the stool intermittently which has been going on for months.  He denies any rectal pain.  I reviewed the past medical records.  The patient was most recently seen at the [facility] ED on 1/16 of this year for scrotal swelling due to cellulitis.   Physical Exam   Triage Vital Signs: ED Triage Vitals  Enc Vitals Group     BP 01/28/23 2225 (!) 151/109     Pulse Rate 01/28/23 2225 78     Resp 01/28/23 2225 20     Temp 01/28/23 2225 98 F (36.7 C)     Temp Source 01/29/23 0126 Oral     SpO2 01/28/23 2225 94 %     Weight 01/28/23 2222 200 lb (90.7 kg)     Height 01/28/23 2222 5\' 8"  (1.727 m)     Head Circumference --      Peak Flow --      Pain Score 01/28/23 2222 8     Pain Loc --      Pain Edu? --      Excl. in GC? --     Most recent vital signs: Vitals:   01/29/23 0300 01/29/23 0330  BP: (!) 150/83 131/87  Pulse: (!) 59 67  Resp: 14 (!) 24  Temp:    SpO2: 98% 98%     General: Alert, well-appearing, no distress.  CV:  Good peripheral  perfusion.  Normal heart sounds. Resp:  Normal effort.  Lungs CTAB. Abd:  No distention.  Other:  No calf or popliteal swelling or tenderness.  Reddish stool, guaiac positive on DRE.  No visible external hemorrhoids.   ED Results / Procedures / Treatments   Labs (all labs ordered are listed, but only abnormal results are displayed) Labs Reviewed  COMPREHENSIVE METABOLIC PANEL - Abnormal; Notable for the following components:      Result Value   CO2 18 (*)    Glucose, Bld 139 (*)    AST 93 (*)    ALT 61 (*)    All other components within normal limits  CBC WITH DIFFERENTIAL/PLATELET - Abnormal; Notable for the following components:   RBC 3.94 (*)    All other components within normal limits  D-DIMER, QUANTITATIVE  TROPONIN I (HIGH SENSITIVITY)  TROPONIN I (HIGH SENSITIVITY)     EKG  ED ECG REPORT I, Dionne Bucy, the attending physician, personally viewed and interpreted this ECG.  Date: 01/29/2023 EKG Time: 2225 Rate: 76 Rhythm: normal sinus  rhythm QRS Axis: normal Intervals: normal ST/T Wave abnormalities: Nonspecific T wave abnormalities Narrative Interpretation: no evidence of acute ischemia    RADIOLOGY  Chest x-ray: I independently viewed and interpreted the images; there is no focal consolidation or edema   PROCEDURES:  Critical Care performed: No  Procedures   MEDICATIONS ORDERED IN ED: Medications - No data to display   IMPRESSION / MDM / ASSESSMENT AND PLAN / ED COURSE  I reviewed the triage vital signs and the nursing notes.  57 year old male with PMH as noted above presents with intermittent chest pain over the last few days, elevated blood pressure, as well as bright red blood per rectum intermittently for months.  Differential diagnosis includes, but is not limited to:  Chest pain: ACS, musculoskeletal pain, GERD, radiculopathy.  I have a lower suspicion for PE.  EKG is nonischemic.  Troponins are negative x 2, so at this time there  is no evidence of ACS.  There is also no evidence of aortic dissection or other vascular etiology.  The vital signs are normal and the patient is well-appearing.  We will obtain a D-dimer; if this is negative the patient will not require further ED workup and will be referred to cardiology as an outpatient.  Hypertension: This is likely chronic.  It could be contributing to the patient's lightheadedness and other symptoms.  He reports multiple elevated blood pressure readings at home.  We will start him on an antihypertensive.  Bright red blood per rectum: Rectal exam is unremarkable.  Differential includes internal hemorrhoid, diverticulosis, less likely malignancy.  The patient will need outpatient GI follow-up.  Patient's presentation is most consistent with acute presentation with potential threat to life or bodily function.  The patient is on the cardiac monitor to evaluate for evidence of arrhythmia and/or significant heart rate changes.  ----------------------------------------- 4:21 AM on 01/29/2023 -----------------------------------------  D-dimer is negative.  Blood pressure has improved.  At this time, the patient is stable for discharge home.  I counseled him on the results of the workup.  I have provided cardiology and GI referrals.  I gave strict return precautions and the patient expressed understanding.  FINAL CLINICAL IMPRESSION(S) / ED DIAGNOSES   Final diagnoses:  Chest pain, unspecified type  Rectal bleeding  Uncontrolled hypertension     Rx / DC Orders   ED Discharge Orders          Ordered    amLODipine (NORVASC) 5 MG tablet  Daily        01/29/23 0418    Ambulatory referral to Cardiology        01/29/23 0419             Note:  This document was prepared using Dragon voice recognition software and may include unintentional dictation errors.    Dionne Bucy, MD 01/29/23 [phone]

## 2023-01-29 NOTE — ED Notes (Signed)
Lab called to add on d-dimer per order

## 2023-01-29 NOTE — Discharge Instructions (Addendum)
Take the amlodipine as prescribed and keep a log of your blood pressures.  Follow-up with cardiology.  A referral has been made, so you should be contacted by the cardiology office to set up an appointment.  You should also follow-up with GI.  You will have to call to set up GI appointment.  Return to the ER for new, worsening, or persistent severe chest pain, difficulty breathing, weakness or lightheadedness, persistently elevated blood pressures especially higher than 180-200 on the top number or 120 on the bottom number, worsening bleeding, or any other new or worsening symptoms that concern you.

## 2023-03-04 DIAGNOSIS — R42 Dizziness and giddiness: Secondary | ICD-10-CM | POA: Diagnosis not present

## 2023-03-04 DIAGNOSIS — W19XXXA Unspecified fall, initial encounter: Secondary | ICD-10-CM | POA: Diagnosis not present

## 2023-03-04 DIAGNOSIS — R0789 Other chest pain: Secondary | ICD-10-CM | POA: Diagnosis not present

## 2023-03-04 DIAGNOSIS — R231 Pallor: Secondary | ICD-10-CM | POA: Diagnosis not present

## 2023-03-05 ENCOUNTER — Encounter: Payer: Self-pay | Admitting: Emergency Medicine

## 2023-03-05 ENCOUNTER — Emergency Department: Payer: Medicaid Other

## 2023-03-05 ENCOUNTER — Other Ambulatory Visit: Payer: Self-pay

## 2023-03-05 ENCOUNTER — Emergency Department
Admission: EM | Admit: 2023-03-05 | Discharge: 2023-03-05 | Disposition: A | Discharge status: Home / Self Care | Payer: Medicaid Other | Attending: Emergency Medicine | Admitting: Emergency Medicine

## 2023-03-05 DIAGNOSIS — E876 Hypokalemia: Secondary | ICD-10-CM | POA: Diagnosis not present

## 2023-03-05 DIAGNOSIS — J40 Bronchitis, not specified as acute or chronic: Secondary | ICD-10-CM | POA: Diagnosis not present

## 2023-03-05 DIAGNOSIS — R079 Chest pain, unspecified: Secondary | ICD-10-CM

## 2023-03-05 DIAGNOSIS — R0789 Other chest pain: Secondary | ICD-10-CM | POA: Diagnosis not present

## 2023-03-05 LAB — COMPREHENSIVE METABOLIC PANEL
ALT: 55 U/L — ABNORMAL HIGH (ref 0–44)
AST: 80 U/L — ABNORMAL HIGH (ref 15–41)
Albumin: 4.7 g/dL (ref 3.5–5.0)
Alkaline Phosphatase: 77 U/L (ref 38–126)
Anion gap: 11 (ref 5–15)
BUN: 9 mg/dL (ref 6–20)
CO2: 22 mmol/L (ref 22–32)
Calcium: 9.6 mg/dL (ref 8.9–10.3)
Chloride: 108 mmol/L (ref 98–111)
Creatinine, Ser: 0.79 mg/dL (ref 0.61–1.24)
GFR, Estimated: >60 mL/min (ref >60)
Glucose, Bld: 96 mg/dL (ref 70–99)
Potassium: 3.1 mmol/L — ABNORMAL LOW (ref 3.5–5.1)
Sodium: 141 mmol/L (ref 135–145)
Total Bilirubin: 0.8 mg/dL (ref 0.3–1.2)
Total Protein: 7.6 g/dL (ref 6.5–8.1)

## 2023-03-05 LAB — CBC
HCT: 37.2 % — ABNORMAL LOW (ref 39.0–52.0)
Hemoglobin: 12.5 g/dL — ABNORMAL LOW (ref 13.0–17.0)
MCH: 33.7 pg (ref 26.0–34.0)
MCHC: 33.6 g/dL (ref 30.0–36.0)
MCV: 100.3 fL — ABNORMAL HIGH (ref 80.0–100.0)
Platelets: 229 10*3/uL (ref 150–400)
RBC: 3.71 MIL/uL — ABNORMAL LOW (ref 4.22–5.81)
RDW: 12.8 % (ref 11.5–15.5)
WBC: 7.2 10*3/uL (ref 4.0–10.5)
nRBC: 0 % (ref 0.0–0.2)

## 2023-03-05 LAB — D-DIMER, QUANTITATIVE: D-Dimer, Quant: 0.87 ug/mL-FEU — ABNORMAL HIGH (ref 0.00–0.50)

## 2023-03-05 LAB — TROPONIN I (HIGH SENSITIVITY)
Troponin I (High Sensitivity): 11 ng/L (ref <18)
Troponin I (High Sensitivity): 12 ng/L (ref <18)

## 2023-03-05 LAB — LIPASE, BLOOD: Lipase: 37 U/L (ref 11–51)

## 2023-03-05 MED ORDER — ALBUTEROL SULFATE HFA 108 (90 BASE) MCG/ACT IN AERS: 2.0000 | INHALATION_SPRAY | RESPIRATORY_TRACT | 0 refills | Status: DC | PRN

## 2023-03-05 MED ORDER — PREDNISONE 20 MG PO TABS: ORAL_TABLET | ORAL | 0 refills | Status: DC

## 2023-03-05 MED ORDER — IOHEXOL 350 MG/ML SOLN
100.0000 mL | Freq: Once | INTRAVENOUS | Status: AC | PRN
  Administered 2023-03-05: 100 mL via INTRAVENOUS

## 2023-03-05 MED ORDER — HYDROCOD POLI-CHLORPHE POLI ER 10-8 MG/5ML PO SUER
5.0000 mL | Freq: Once | ORAL | Status: AC
  Administered 2023-03-05: 5 mL via ORAL
  Filled 2023-03-05: qty 5

## 2023-03-05 MED ORDER — POTASSIUM CHLORIDE CRYS ER 20 MEQ PO TBCR
40.0000 meq | EXTENDED_RELEASE_TABLET | Freq: Once | ORAL | Status: AC
  Administered 2023-03-05: 40 meq via ORAL
  Filled 2023-03-05: qty 2

## 2023-03-05 MED ORDER — ONDANSETRON HCL 4 MG/2ML IJ SOLN
4.0000 mg | Freq: Once | INTRAMUSCULAR | Status: AC
  Administered 2023-03-05: 4 mg via INTRAVENOUS
  Filled 2023-03-05: qty 2

## 2023-03-05 MED ORDER — FAMOTIDINE IN NACL 20-0.9 MG/50ML-% IV SOLN
20.0000 mg | Freq: Once | INTRAVENOUS | Status: AC
  Administered 2023-03-05: 20 mg via INTRAVENOUS
  Filled 2023-03-05: qty 50

## 2023-03-05 MED ORDER — MORPHINE SULFATE (PF) 4 MG/ML IV SOLN
4.0000 mg | Freq: Once | INTRAVENOUS | Status: AC
  Administered 2023-03-05: 4 mg via INTRAVENOUS
  Filled 2023-03-05: qty 1

## 2023-03-05 MED ORDER — SODIUM CHLORIDE 0.9 % IV BOLUS
500.0000 mL | Freq: Once | INTRAVENOUS | Status: AC
  Administered 2023-03-05: 500 mL via INTRAVENOUS

## 2023-03-05 NOTE — ED Triage Notes (Signed)
See first note, patient c/o same, states pain has gotten better since EMS placed nitropaste.

## 2023-03-05 NOTE — ED Provider Notes (Signed)
[facility] Provider Note    Event Date/Time   First MD Initiated Contact with Patient 03/05/23 [phone]     (approximate)   History   Chest Pain   HPI  Ricardo Goodman is a 57 y.o. male brought to the ED via EMS from home with a chief complaint of chest pain.  Patient reports sharp left-sided chest pain beginning around noon while at rest with radiation to his left arm.  Denies associated diaphoresis, palpitations, nausea/vomiting or dizziness.  Seen in the ED last month for same and started on amlodipine, reports compliance.  EMS reports elevated blood pressure before administering baby aspirin and nitroglycerin paste.  Patient reports improvement.  Denies recent fever/chills, cough, abdominal pain, dysuria or diarrhea.  Denies recent trauma, travel or hormone use.  Denies illicit substances.  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     Past Medical History   Past Medical History:  Diagnosis Date   DVT (deep venous thrombosis) (HCC)      Active Problem List  There are no problems to display for this patient.    Past Surgical History   Past Surgical History:  Procedure Laterality Date   ANKLE SURGERY     ORIF PROXIMAL TIBIOFIBULAR JOINT       Home Medications   Prior to Admission medications   Medication Sig Start Date End Date Taking? Authorizing Provider  albuterol (VENTOLIN HFA) 108 (90 Base) MCG/ACT inhaler Inhale 2 puffs into the lungs every 4 (four) hours as needed for wheezing or shortness of breath. 03/05/23  Yes Irean Hong, MD  predniSONE (DELTASONE) 20 MG tablet 3 tablets daily x 5 days 03/05/23  Yes Irean Hong, MD  amLODipine (NORVASC) 5 MG tablet Take 1 tablet (5 mg total) by mouth daily. 01/29/23 01/29/24  Dionne Bucy, MD  cyclobenzaprine (FLEXERIL) 5 MG tablet Take 1 tablet (5 mg total) by mouth 3 (three) times daily as needed for muscle spasms. 01/06/15   Menshew, Charlesetta Ivory, PA-C  gabapentin (NEURONTIN)  300 MG capsule Take 300 mg by mouth 3 (three) times daily.    [provider]  ibuprofen (ADVIL,MOTRIN) 100 MG/5ML suspension Take 200 mg by mouth every 4 (four) hours as needed for mild pain.     [provider]  ketorolac (TORADOL) 10 MG tablet Take 1 tablet (10 mg total) by mouth every 8 (eight) hours. 01/06/15   Menshew, Charlesetta Ivory, PA-C     Allergies  Patient has no known allergies.   Family History  History reviewed. No pertinent family history.   Physical Exam  Triage Vital Signs: ED Triage Vitals  Encounter Vitals Group     BP 03/05/23 0040 131/89     Systolic BP Percentile --      Diastolic BP Percentile --      Pulse Rate 03/05/23 0040 69     Resp 03/05/23 0040 18     Temp 03/05/23 0040 98 F (36.7 C)     Temp Source 03/05/23 0040 Oral     SpO2 03/05/23 0040 95 %     Weight 03/05/23 0040 200 lb (90.7 kg)     Height 03/05/23 0040 5\' 8"  (1.727 m)     Head Circumference --      Peak Flow --      Pain Score 03/05/23 0042 8     Pain Loc --      Pain Education --      Exclude  from Growth Chart --     Updated Vital Signs: BP 118/69   Pulse 66   Temp 98 F (36.7 C) (Oral)   Resp 16   Ht 5\' 8"  (1.727 m)   Wt 90.7 kg   SpO2 96%   BMI 30.41 kg/m    General: Awake, no distress.  CV:  RRR.  Good peripheral perfusion.  Resp:  Normal effort.  CTAB.  Mildly tender left anterior chest. Abd:  Nontender.  No distention.  Other:  No truncal vesicles.  Bilateral calves are supple and nontender.   ED Results / Procedures / Treatments  Labs (all labs ordered are listed, but only abnormal results are displayed) Labs Reviewed  CBC - Abnormal; Notable for the following components:      Result Value   RBC 3.71 (*)    Hemoglobin 12.5 (*)    HCT 37.2 (*)    MCV 100.3 (*)    All other components within normal limits  COMPREHENSIVE METABOLIC PANEL - Abnormal; Notable for the following components:   Potassium 3.1 (*)    AST 80 (*)    ALT 55 (*)     All other components within normal limits  D-DIMER, QUANTITATIVE - Abnormal; Notable for the following components:   D-Dimer, Quant 0.87 (*)    All other components within normal limits  LIPASE, BLOOD  TROPONIN I (HIGH SENSITIVITY)  TROPONIN I (HIGH SENSITIVITY)     EKG  ED ECG REPORT I, Nelva Hauk J, the attending physician, personally viewed and interpreted this ECG.   Date: 03/05/2023  EKG Time: 0044  Rate: 62  Rhythm: normal sinus rhythm  Axis: Normal  Intervals:none  ST&T Change: Nonspecific    RADIOLOGY I have independently visualized and interpreted patient's chest x-ray and CT scan as well as noted the radiology interpretation:  Chest x-ray: No acute cardiopulmonary process  CTA chest: No PE, bronchitis  Official radiology report(s): CT Angio Chest PE W/Cm &/Or Wo Cm  Result Date: 03/05/2023 CLINICAL DATA:  Pulmonary embolism suspected with low to intermediate probability, positive D-dimer with worsening chest pain throughout the day, left side with radiation to the left extremities. EXAM: CT ANGIOGRAPHY CHEST WITH CONTRAST TECHNIQUE: Multidetector CT imaging of the chest was performed using the standard protocol during bolus administration of intravenous contrast. Multiplanar CT image reconstructions and MIPs were obtained to evaluate the vascular anatomy. RADIATION DOSE REDUCTION: This exam was performed according to the departmental dose-optimization program which includes automated exposure control, adjustment of the mA and/or kV according to patient size and/or use of iterative reconstruction technique. CONTRAST:  OMNIPAQUE IOHEXOL 350 MG/ML SOLN COMPARISON:  Portable chest today, PA and lateral chest 01/28/2023, CTA chest 06/03/2013. FINDINGS: Cardiovascular: The pulmonary arteries are normal in caliber without evidence of emboli. The cardiac size is normal. There are no visible coronary artery calcifications. There is trace calcific plaque of the distal  aortic arch. There is no aortic aneurysm or dissection, with mild tortuosity. The great vessels are not well opacified but branch normally. The pulmonary veins are normal caliber. Mediastinum/Nodes: No enlarged mediastinal, hilar, or axillary lymph nodes. The lower poles of the thyroid gland, the trachea, and esophagus demonstrate no significant findings. The main bronchi are patent. Lungs/Pleura: There is bronchial thickening in the upper and more so of the lower lobes. There is posterior atelectasis in the lower lobes. The lungs are clear of infiltrates and nodules. There is no pleural effusion, thickening or pneumothorax. Upper Abdomen: There are right renal simple  cysts. There is severe hepatic steatosis. No acute upper abdominal findings. Musculoskeletal: No acute or significant osseous findings or chest wall abnormality. Mild thoracic dextroscoliosis. Review of the MIP images confirms the above findings. IMPRESSION: 1. No evidence of arterial dilatation or embolus. 2. Trace aortic atherosclerosis. 3. Bronchitis in the upper and more so of the lower lobes. No evidence of pneumonia. 4. Severe hepatic steatosis. 5. Right renal simple cysts. Aortic Atherosclerosis (ICD10-I70.0). Electronically Signed   By: Almira Bar M.D.   On: 03/05/2023 02:56   DG Chest Port 1 View  Result Date: 03/05/2023 CLINICAL DATA:  Chest pain EXAM: PORTABLE CHEST 1 VIEW COMPARISON:  01/28/2023 FINDINGS: The heart size and mediastinal contours are within normal limits. Both lungs are clear. The visualized skeletal structures are unremarkable. IMPRESSION: No active disease. Electronically Signed   By: Alcide Clever M.D.   On: 03/05/2023 01:39     PROCEDURES:  Critical Care performed: No  .1-3 Lead EKG Interpretation  Performed by: Irean Hong, MD Authorized by: Irean Hong, MD     Interpretation: normal     ECG rate:  70   ECG rate assessment: normal     Rhythm: sinus rhythm     Ectopy: none     Conduction: normal    Comments:     Patient placed on cardiac monitor to evaluate for arrhythmias    MEDICATIONS ORDERED IN ED: Medications  famotidine (PEPCID) IVPB 20 mg premix (0 mg Intravenous Stopped 03/05/23 0321)  morphine (PF) 4 MG/ML injection 4 mg (4 mg Intravenous Given 03/05/23 0127)  ondansetron (ZOFRAN) injection 4 mg (4 mg Intravenous Given 03/05/23 0127)  sodium chloride 0.9 % bolus 500 mL (0 mLs Intravenous Stopped 03/05/23 0358)  potassium chloride SA (KLOR-CON M) CR tablet 40 mEq (40 mEq Oral Given 03/05/23 0251)  iohexol (OMNIPAQUE) 350 MG/ML injection 100 mL (100 mLs Intravenous Contrast Given 03/05/23 0159)  chlorpheniramine-HYDROcodone (TUSSIONEX) 10-8 MG/5ML suspension 5 mL (5 mLs Oral Given 03/05/23 0402)     IMPRESSION / MDM / ASSESSMENT AND PLAN / ED COURSE  I reviewed the triage vital signs and the nursing notes.                             57 year old male presenting with chest pain. Differential diagnosis includes, but is not limited to, ACS, aortic dissection, pulmonary embolism, cardiac tamponade, pneumothorax, pneumonia, pericarditis, myocarditis, GI-related causes including esophagitis/gastritis, and musculoskeletal chest wall pain.   I personally reviewed patient's records and note a similar ED visit on 01/29/2023 for chest pain and hypertension; patient was started on amlodipine 5 mg daily at that time.  Patient's presentation is most consistent with acute presentation with potential threat to life or bodily function.  The patient is on the cardiac monitor to evaluate for evidence of arrhythmia and/or significant heart rate changes.  Will obtain cardiac panel, chest x-ray, include D-dimer given patient's remote history of DVT.  Administer IV morphine and Pepcid for pain paired with IV Zofran to prevent nausea.  Will reassess.  Clinical Course as of 03/05/23 0650  Sun Mar 05, 2023  0147 Laboratory results demonstrate normal WBC 7.2, mild hypokalemia with potassium 3.1, initial  troponin negative.  D-dimer slightly elevated, will proceed with CTA chest to evaluate for PE. [JS]  0449 Repeat troponin remains unremarkable.  Patient feeling significantly better and eager for discharge home.  Will place on 5-day prednisone burst for bronchitis, albuterol inhaler and  refer to cardiology for outpatient follow-up.  Strict return precautions given.  Patient verbalizes understanding and agrees with plan of care. [JS]    Clinical Course User Index [JS] Irean Hong, MD     FINAL CLINICAL IMPRESSION(S) / ED DIAGNOSES   Final diagnoses:  Nonspecific chest pain  Hypokalemia  Bronchitis     Rx / DC Orders   ED Discharge Orders          Ordered    predniSONE (DELTASONE) 20 MG tablet        03/05/23 0450    albuterol (VENTOLIN HFA) 108 (90 Base) MCG/ACT inhaler  Every 4 hours PRN        03/05/23 0450    Ambulatory referral to Cardiology       Comments: If you have not heard from the Cardiology office within the next 72 hours please call [phone].   03/05/23 0450             Note:  This document was prepared using Dragon voice recognition software and may include unintentional dictation errors.   Irean Hong, MD 03/05/23 [phone]

## 2023-03-05 NOTE — ED Triage Notes (Signed)
First Nurse Note:  BIB [facility]. Pt c/o chest pain with gradual worsening throughout the day. Sharp L sided pain with radiation to L arm and leg. 1.5 inch nitroglycerine given en route with 324 asa and pt reports slight improvement. 18G LAC intitated by EMS. Per EMS report pt alert and oriented.   180/120 before nitroglyc. And then 157/94 after  HR 66-- NSR on EMS EKG 96% RA  RR 18 CBG 96

## 2023-03-05 NOTE — Discharge Instructions (Addendum)
Take steroid as prescribed. Use Albuterol inhaler 2 puffs every 4 hours as needed for difficulty.  Return to the ER for worsening symptoms, persistent vomiting, difficulty breathing or other concerns.

## 2023-03-10 ENCOUNTER — Ambulatory Visit: Payer: Medicaid Other | Attending: Cardiology | Admitting: Cardiology

## 2023-03-10 ENCOUNTER — Encounter: Payer: Self-pay | Admitting: Cardiology

## 2023-03-10 VITALS — BP 142/84 | HR 53 | Ht 68.0 in | Wt 201.2 lb

## 2023-03-10 DIAGNOSIS — I10 Essential (primary) hypertension: Secondary | ICD-10-CM

## 2023-03-10 DIAGNOSIS — R072 Precordial pain: Secondary | ICD-10-CM

## 2023-03-10 DIAGNOSIS — R079 Chest pain, unspecified: Secondary | ICD-10-CM

## 2023-03-10 DIAGNOSIS — K219 Gastro-esophageal reflux disease without esophagitis: Secondary | ICD-10-CM

## 2023-03-10 MED ORDER — OMEPRAZOLE 20 MG PO CPDR: 20.0000 mg | DELAYED_RELEASE_CAPSULE | Freq: Every day | ORAL | 0 refills | Status: DC

## 2023-03-10 NOTE — Patient Instructions (Signed)
Medication Instructions:   START Prilosec - Take one tablet ( 20mg ) by mouth daily.   *If you need a refill on your cardiac medications before your next appointment, please call your pharmacy*   Lab Work:  None ordered  If you have labs (blood work) drawn today and your tests are completely normal, you will receive your results only by: MyChart Message (if you have MyChart) OR A paper copy in the mail If you have any lab test that is abnormal or we need to change your treatment, we will call you to review the results.   Testing/Procedures:  Your physician has requested that you have an echocardiogram. Echocardiography is a painless test that uses sound waves to create images of your heart. It provides your doctor with information about the size and shape of your heart and how well your heart's chambers and valves are working. This procedure takes approximately one hour. There are no restrictions for this procedure. Please do NOT wear cologne, perfume, aftershave, or lotions (deodorant is allowed). Please arrive 15 minutes prior to your appointment time.     Your cardiac CT will be scheduled at one of the below locations:   [facility] [address] [place], [place] [address] [phone]  OR   Heart and Vascular  [address] [place], [place] [address] [phone]  If scheduled at [facility] or [facility], please arrive 15 mins early for check-in and test prep.  There is spacious parking and easy access to the radiology department from the [facility] entrance. Please enter here and check-in with the desk attendant.   Please follow these instructions carefully (unless otherwise directed):  An IV will be required for this test and Nitroglycerin will be given.  Hold all erectile dysfunction medications at least 3 days (72 hrs) prior to test. (Ie viagra,  cialis, sildenafil, tadalafil, etc)   On the Night Before the Test: Be sure to Drink plenty of water. Do not consume any caffeinated/decaffeinated beverages or chocolate 12 hours prior to your test. Do not take any antihistamines 12 hours prior to your test.  On the Day of the Test: Drink plenty of water until 1 hour prior to the test. Do not eat any food 1 hour prior to test. You may take your regular medications prior to the test.   After the Test: Drink plenty of water. After receiving IV contrast, you may experience a mild flushed feeling. This is normal. On occasion, you may experience a mild rash up to 24 hours after the test. This is not dangerous. If this occurs, you can take Benadryl 25 mg and increase your fluid intake. If you experience trouble breathing, this can be serious. If it is severe call 911 IMMEDIATELY. If it is mild, please call our office.   We will call to schedule your test 2-4 weeks out understanding that some insurance companies will need an authorization prior to the service being performed.   For more information and frequently asked questions, please visit our website : [email or web address]  For non-scheduling related questions, please contact the cardiac imaging nurse navigator should you have any questions/concerns: Cardiac Imaging Nurse Navigators Direct Office Dial: [phone]   For scheduling needs, including cancellations and rescheduling, please call [place], [phone].  Follow-Up: At [facility], you and your health needs are our priority.  As part of our continuing mission to provide you with exceptional heart care, we  have created designated Provider Care Teams.  These Care Teams include your primary Cardiologist (physician) and Advanced Practice Providers (APPs -  Physician Assistants and Nurse Practitioners) who all work together to provide you with the care you need, when you need it.  We recommend signing up for  the patient portal called "MyChart".  Sign up information is provided on this After Visit Summary.  MyChart is used to connect with patients for Virtual Visits (Telemedicine).  Patients are able to view lab/test results, encounter notes, upcoming appointments, etc.  Non-urgent messages can be sent to your provider as well.   To learn more about what you can do with MyChart, go to [email or web address].    Your next appointment:    After testing  Provider:   You may see Debbe Odea, MD or one of the following Advanced Practice Providers on your designated Care Team:   Nicolasa Ducking, NP Eula Listen, PA-C Cadence Fransico Michael, PA-C Charlsie Quest, NP

## 2023-03-10 NOTE — Progress Notes (Signed)
Cardiology Office Note:    Date:  03/10/2023   ID:  Ricardo Goodman, DOB 1966-05-09, MRN [id number]  PCP:  [facility]   [facility] Providers Cardiologist:  Debbe Odea, MD     Referring MD: Irean Hong, MD   Chief Complaint  Patient presents with   New Patient (Initial Visit)    Referred by ED for chest pain evaluation.  Patient reports still having intermittent left side chest pain with SOBr.    History of Present Illness:    Ricardo Goodman is a 57 y.o. male with a hx of hypertension who presents with chest pain.   Symptoms of left sided chest pain began 1 month ago, not associated with exertion. He endorses shortness of breath with exertion. Denies any personal or family hx of heart disease. Was evaluated in the ED 5 days ago. EKG and troponins were unrevealing. He endorses occasional heartburn but current chest pain is different from his typical heartburn.  Past Medical History:  Diagnosis Date   DVT (deep venous thrombosis) (HCC)    Gastric ulcer 1990   Hypertension     Past Surgical History:  Procedure Laterality Date   ANKLE SURGERY     gastric ulcer surgery  1999   ORIF PROXIMAL TIBIOFIBULAR JOINT      Current Medications: Current Meds  Medication Sig   albuterol (VENTOLIN HFA) 108 (90 Base) MCG/ACT inhaler Inhale 2 puffs into the lungs every 4 (four) hours as needed for wheezing or shortness of breath.   amLODipine (NORVASC) 5 MG tablet Take 1 tablet (5 mg total) by mouth daily.   cyclobenzaprine (FLEXERIL) 5 MG tablet Take 1 tablet (5 mg total) by mouth 3 (three) times daily as needed for muscle spasms.   gabapentin (NEURONTIN) 300 MG capsule Take 300 mg by mouth 3 (three) times daily.   omeprazole (PRILOSEC) 20 MG capsule Take 1 capsule (20 mg total) by mouth daily.     Allergies:   Patient has no known allergies.   Social History   Socioeconomic History   Marital status: Married    Spouse name: Not on file    Number of children: Not on file   Years of education: Not on file   Highest education level: Not on file  Occupational History   Not on file  Tobacco Use   Smoking status: Never   Smokeless tobacco: Never  Vaping Use   Vaping status: Never Used  Substance and Sexual Activity   Alcohol use: Yes    Comment: occ   Drug use: No   Sexual activity: Yes  Other Topics Concern   Not on file  Social History Narrative   Not on file   Social Determinants of Health   Financial Resource Strain: Not on file  Food Insecurity: Not on file  Transportation Needs: Not on file  Physical Activity: Not on file  Stress: Not on file  Social Connections: Not on file     Family History: The patient's family history is not on file.  ROS:   Please see the history of present illness.     All other systems reviewed and are negative.  EKGs/Labs/Other Studies Reviewed:    The following studies were reviewed today:  EKG shows sinus bradycardia, heart rate 53., non specific T-wave changes      Recent Labs: 03/05/2023: ALT 55; BUN 9; Creatinine, Ser 0.79; Hemoglobin 12.5; Platelets 229; Potassium 3.1; Sodium 141  Recent Lipid Panel No results  found for: "CHOL", "TRIG", "HDL", "CHOLHDL", "VLDL", "LDLCALC", "LDLDIRECT"   Risk Assessment/Calculations:    HYPERTENSION CONTROL Vitals:   03/10/23 1024 03/10/23 1031  BP: 134/88 (!) 142/84    The patient's blood pressure is elevated above target today.  In order to address the patient's elevated BP: Blood pressure will be monitored at home to determine if medication changes need to be made.            Physical Exam:    VS:  BP (!) 142/84 (BP Location: Right Arm, Patient Position: Sitting, Cuff Size: Large)   Pulse (!) 53   Ht 5\' 8"  (1.727 m)   Wt 201 lb 3.2 oz (91.3 kg)   SpO2 97%   BMI 30.59 kg/m     Wt Readings from Last 3 Encounters:  03/10/23 201 lb 3.2 oz (91.3 kg)  03/05/23 200 lb (90.7 kg)  01/28/23 200 lb (90.7 kg)      GEN:  Well nourished, well developed in no acute distress HEENT: Normal NECK: No JVD; No carotid bruits CARDIAC: RRR, no murmurs, rubs, gallops RESPIRATORY:  Clear to auscultation without rales, wheezing or rhonchi  ABDOMEN: Soft, non-tender, non-distended MUSCULOSKELETAL:  No edema; No deformity  SKIN: Warm and dry NEUROLOGIC:  Alert and oriented x 3 PSYCHIATRIC:  Normal affect   ASSESSMENT:    1. Precordial pain   2. Primary hypertension   3. Gastroesophageal reflux disease without esophagitis   4. Chest pain, unspecified type    PLAN:    In order of problems listed above:  Chest pain and dyspnea on exertion, risk factors htn. Obtain echo and CCTA. Hypertension, bp elevated. Cont norvasc 5mg  daily. Titrate at f/u visit if bp stays elevated. GERD, start omeprazole 20mg  daily.    F/u after cardiac testing  Medication Adjustments/Labs and Tests Ordered: Current medicines are reviewed at length with the patient today.  Concerns regarding medicines are outlined above.  Orders Placed This Encounter  Procedures   CT CORONARY MORPH W/CTA COR W/SCORE W/CA W/CM &/OR WO/CM   EKG 12-Lead   ECHOCARDIOGRAM COMPLETE   Meds ordered this encounter  Medications   omeprazole (PRILOSEC) 20 MG capsule    Sig: Take 1 capsule (20 mg total) by mouth daily.    Dispense:  90 capsule    Refill:  0    Patient Instructions  Medication Instructions:   START Prilosec - Take one tablet ( 20mg ) by mouth daily.   *If you need a refill on your cardiac medications before your next appointment, please call your pharmacy*   Lab Work:  None ordered  If you have labs (blood work) drawn today and your tests are completely normal, you will receive your results only by: MyChart Message (if you have MyChart) OR A paper copy in the mail If you have any lab test that is abnormal or we need to change your treatment, we will call you to review the results.   Testing/Procedures:  Your physician  has requested that you have an echocardiogram. Echocardiography is a painless test that uses sound waves to create images of your heart. It provides your doctor with information about the size and shape of your heart and how well your heart's chambers and valves are working. This procedure takes approximately one hour. There are no restrictions for this procedure. Please do NOT wear cologne, perfume, aftershave, or lotions (deodorant is allowed). Please arrive 15 minutes prior to your appointment time.     Your cardiac CT will be  scheduled at one of the below locations:   [facility] [address] [place], [place] [address] [phone]  OR   Heart and Vascular  [address] [place], [place] [address] [phone]  If scheduled at [facility] or [facility], please arrive 15 mins early for check-in and test prep.  There is spacious parking and easy access to the radiology department from the [facility] entrance. Please enter here and check-in with the desk attendant.   Please follow these instructions carefully (unless otherwise directed):  An IV will be required for this test and Nitroglycerin will be given.  Hold all erectile dysfunction medications at least 3 days (72 hrs) prior to test. (Ie viagra, cialis, sildenafil, tadalafil, etc)   On the Night Before the Test: Be sure to Drink plenty of water. Do not consume any caffeinated/decaffeinated beverages or chocolate 12 hours prior to your test. Do not take any antihistamines 12 hours prior to your test.  On the Day of the Test: Drink plenty of water until 1 hour prior to the test. Do not eat any food 1 hour prior to test. You may take your regular medications prior to the test.   After the Test: Drink plenty of water. After receiving IV contrast, you may experience a mild flushed feeling. This is normal. On  occasion, you may experience a mild rash up to 24 hours after the test. This is not dangerous. If this occurs, you can take Benadryl 25 mg and increase your fluid intake. If you experience trouble breathing, this can be serious. If it is severe call 911 IMMEDIATELY. If it is mild, please call our office.   We will call to schedule your test 2-4 weeks out understanding that some insurance companies will need an authorization prior to the service being performed.   For more information and frequently asked questions, please visit our website : [email or web address]  For non-scheduling related questions, please contact the cardiac imaging nurse navigator should you have any questions/concerns: Cardiac Imaging Nurse Navigators Direct Office Dial: [phone]   For scheduling needs, including cancellations and rescheduling, please call [place], [phone].  Follow-Up: At [facility], you and your health needs are our priority.  As part of our continuing mission to provide you with exceptional heart care, we have created designated Provider Care Teams.  These Care Teams include your primary Cardiologist (physician) and Advanced Practice Providers (APPs -  Physician Assistants and Nurse Practitioners) who all work together to provide you with the care you need, when you need it.  We recommend signing up for the patient portal called "MyChart".  Sign up information is provided on this After Visit Summary.  MyChart is used to connect with patients for Virtual Visits (Telemedicine).  Patients are able to view lab/test results, encounter notes, upcoming appointments, etc.  Non-urgent messages can be sent to your provider as well.   To learn more about what you can do with MyChart, go to [email or web address].    Your next appointment:    After testing  Provider:   You may see Debbe Odea, MD or one of the following Advanced Practice Providers on your designated Care  Team:   Nicolasa Ducking, NP Eula Listen, PA-C Cadence Fransico Michael, PA-C Charlsie Quest, NP   Signed, Debbe Odea, MD  03/10/2023 11:31 AM    [facility]

## 2023-03-27 ENCOUNTER — Inpatient Hospital Stay (HOSPITAL_COMMUNITY)
Admit: 2023-03-27 | Discharge: 2023-03-27 | Disposition: A | Discharge status: Home / Self Care | Payer: Medicaid Other | Attending: Family Medicine | Admitting: Family Medicine

## 2023-03-27 ENCOUNTER — Emergency Department: Payer: Medicaid Other

## 2023-03-27 ENCOUNTER — Other Ambulatory Visit: Payer: Self-pay

## 2023-03-27 ENCOUNTER — Inpatient Hospital Stay
Admission: EM | Admit: 2023-03-27 | Discharge: 2023-03-29 | DRG: 394 | Disposition: A | Discharge status: Home / Self Care | Payer: Medicaid Other | Attending: Internal Medicine | Admitting: Internal Medicine

## 2023-03-27 ENCOUNTER — Encounter: Payer: Self-pay | Admitting: Emergency Medicine

## 2023-03-27 ENCOUNTER — Inpatient Hospital Stay: Payer: Medicaid Other

## 2023-03-27 DIAGNOSIS — K921 Melena: Secondary | ICD-10-CM | POA: Diagnosis not present

## 2023-03-27 DIAGNOSIS — K649 Unspecified hemorrhoids: Secondary | ICD-10-CM | POA: Diagnosis not present

## 2023-03-27 DIAGNOSIS — K922 Gastrointestinal hemorrhage, unspecified: Secondary | ICD-10-CM | POA: Diagnosis not present

## 2023-03-27 DIAGNOSIS — F1010 Alcohol abuse, uncomplicated: Secondary | ICD-10-CM | POA: Diagnosis not present

## 2023-03-27 DIAGNOSIS — D125 Benign neoplasm of sigmoid colon: Secondary | ICD-10-CM | POA: Diagnosis present

## 2023-03-27 DIAGNOSIS — R7401 Elevation of levels of liver transaminase levels: Secondary | ICD-10-CM | POA: Diagnosis present

## 2023-03-27 DIAGNOSIS — K760 Fatty (change of) liver, not elsewhere classified: Secondary | ICD-10-CM | POA: Diagnosis present

## 2023-03-27 DIAGNOSIS — Z79899 Other long term (current) drug therapy: Secondary | ICD-10-CM

## 2023-03-27 DIAGNOSIS — F17290 Nicotine dependence, other tobacco product, uncomplicated: Secondary | ICD-10-CM | POA: Diagnosis present

## 2023-03-27 DIAGNOSIS — I272 Pulmonary hypertension, unspecified: Secondary | ICD-10-CM | POA: Diagnosis present

## 2023-03-27 DIAGNOSIS — K625 Hemorrhage of anus and rectum: Secondary | ICD-10-CM | POA: Diagnosis not present

## 2023-03-27 DIAGNOSIS — I340 Nonrheumatic mitral (valve) insufficiency: Secondary | ICD-10-CM | POA: Diagnosis present

## 2023-03-27 DIAGNOSIS — K219 Gastro-esophageal reflux disease without esophagitis: Secondary | ICD-10-CM | POA: Diagnosis present

## 2023-03-27 DIAGNOSIS — K635 Polyp of colon: Secondary | ICD-10-CM | POA: Diagnosis not present

## 2023-03-27 DIAGNOSIS — K5730 Diverticulosis of large intestine without perforation or abscess without bleeding: Secondary | ICD-10-CM | POA: Diagnosis present

## 2023-03-27 DIAGNOSIS — K648 Other hemorrhoids: Secondary | ICD-10-CM | POA: Diagnosis not present

## 2023-03-27 DIAGNOSIS — I10 Essential (primary) hypertension: Secondary | ICD-10-CM | POA: Diagnosis present

## 2023-03-27 DIAGNOSIS — Z7141 Alcohol abuse counseling and surveillance of alcoholic: Secondary | ICD-10-CM

## 2023-03-27 DIAGNOSIS — R0789 Other chest pain: Secondary | ICD-10-CM | POA: Diagnosis not present

## 2023-03-27 DIAGNOSIS — R079 Chest pain, unspecified: Secondary | ICD-10-CM | POA: Diagnosis present

## 2023-03-27 DIAGNOSIS — I200 Unstable angina: Secondary | ICD-10-CM | POA: Diagnosis present

## 2023-03-27 DIAGNOSIS — E876 Hypokalemia: Secondary | ICD-10-CM | POA: Diagnosis present

## 2023-03-27 DIAGNOSIS — Z8711 Personal history of peptic ulcer disease: Secondary | ICD-10-CM | POA: Diagnosis not present

## 2023-03-27 DIAGNOSIS — Y908 Blood alcohol level of 240 mg/100 ml or more: Secondary | ICD-10-CM | POA: Diagnosis present

## 2023-03-27 DIAGNOSIS — Z86718 Personal history of other venous thrombosis and embolism: Secondary | ICD-10-CM

## 2023-03-27 DIAGNOSIS — R072 Precordial pain: Secondary | ICD-10-CM

## 2023-03-27 LAB — CBC WITH DIFFERENTIAL/PLATELET
Abs Immature Granulocytes: 0.03 10*3/uL (ref 0.00–0.07)
Basophils Absolute: 0.1 10*3/uL (ref 0.0–0.1)
Basophils Relative: 1 %
Eosinophils Absolute: 0.2 10*3/uL (ref 0.0–0.5)
Eosinophils Relative: 2 %
HCT: 39.6 % (ref 39.0–52.0)
Hemoglobin: 13.4 g/dL (ref 13.0–17.0)
Immature Granulocytes: 0 %
Lymphocytes Relative: 44 %
Lymphs Abs: 3.7 10*3/uL (ref 0.7–4.0)
MCH: 34 pg (ref 26.0–34.0)
MCHC: 33.8 g/dL (ref 30.0–36.0)
MCV: 100.5 fL — ABNORMAL HIGH (ref 80.0–100.0)
Monocytes Absolute: 0.8 10*3/uL (ref 0.1–1.0)
Monocytes Relative: 10 %
Neutro Abs: 3.6 10*3/uL (ref 1.7–7.7)
Neutrophils Relative %: 43 %
Platelets: 246 10*3/uL (ref 150–400)
RBC: 3.94 MIL/uL — ABNORMAL LOW (ref 4.22–5.81)
RDW: 12.9 % (ref 11.5–15.5)
WBC: 8.3 10*3/uL (ref 4.0–10.5)
nRBC: 0 % (ref 0.0–0.2)

## 2023-03-27 LAB — URINE DRUG SCREEN, QUALITATIVE (ARMC ONLY)
Amphetamines, Ur Screen: NOT DETECTED
Barbiturates, Ur Screen: NOT DETECTED
Benzodiazepine, Ur Scrn: NOT DETECTED
Cannabinoid 50 Ng, Ur ~~LOC~~: NOT DETECTED
Cocaine Metabolite,Ur ~~LOC~~: NOT DETECTED
MDMA (Ecstasy)Ur Screen: NOT DETECTED
Methadone Scn, Ur: NOT DETECTED
Opiate, Ur Screen: NOT DETECTED
Phencyclidine (PCP) Ur S: NOT DETECTED
Tricyclic, Ur Screen: NOT DETECTED

## 2023-03-27 LAB — ECHOCARDIOGRAM COMPLETE
AR max vel: 3.08 cm2
AV Area VTI: 3.02 cm2
AV Area mean vel: 2.85 cm2
AV Mean grad: 4 mmHg
AV Peak grad: 8.1 mmHg
Ao pk vel: 1.42 m/s
Area-P 1/2: 3.51 cm2
Calc EF: 64 %
Height: 68 in
MV VTI: 3.48 cm2
S' Lateral: 3.2 cm
Single Plane A2C EF: 65.7 %
Single Plane A4C EF: 63 %
Weight: 3216.95 oz

## 2023-03-27 LAB — HEMOGLOBIN A1C
Hgb A1c MFr Bld: 5.1 % (ref 4.8–5.6)
Mean Plasma Glucose: 99.67 mg/dL

## 2023-03-27 LAB — COMPREHENSIVE METABOLIC PANEL
ALT: 60 U/L — ABNORMAL HIGH (ref 0–44)
AST: 100 U/L — ABNORMAL HIGH (ref 15–41)
Albumin: 4.9 g/dL (ref 3.5–5.0)
Alkaline Phosphatase: 86 U/L (ref 38–126)
Anion gap: 17 — ABNORMAL HIGH (ref 5–15)
BUN: 8 mg/dL (ref 6–20)
CO2: 20 mmol/L — ABNORMAL LOW (ref 22–32)
Calcium: 9.6 mg/dL (ref 8.9–10.3)
Chloride: 107 mmol/L (ref 98–111)
Creatinine, Ser: 0.95 mg/dL (ref 0.61–1.24)
GFR, Estimated: >60 mL/min (ref >60)
Glucose, Bld: 101 mg/dL — ABNORMAL HIGH (ref 70–99)
Potassium: 3 mmol/L — ABNORMAL LOW (ref 3.5–5.1)
Sodium: 144 mmol/L (ref 135–145)
Total Bilirubin: 0.8 mg/dL (ref 0.3–1.2)
Total Protein: 8 g/dL (ref 6.5–8.1)

## 2023-03-27 LAB — ETHANOL: Alcohol, Ethyl (B): 323 mg/dL (ref <10)

## 2023-03-27 LAB — TROPONIN I (HIGH SENSITIVITY)
Troponin I (High Sensitivity): 16 ng/L (ref <18)
Troponin I (High Sensitivity): 12 ng/L (ref <18)

## 2023-03-27 LAB — PROTIME-INR
INR: 0.9 (ref 0.8–1.2)
Prothrombin Time: 12.7 seconds (ref 11.4–15.2)

## 2023-03-27 LAB — BRAIN NATRIURETIC PEPTIDE: B Natriuretic Peptide: 12.9 pg/mL (ref 0.0–100.0)

## 2023-03-27 LAB — HIV ANTIBODY (ROUTINE TESTING W REFLEX): HIV Screen 4th Generation wRfx: NONREACTIVE

## 2023-03-27 LAB — LIPASE, BLOOD: Lipase: 43 U/L (ref 11–51)

## 2023-03-27 LAB — APTT: aPTT: 33 seconds (ref 24–36)

## 2023-03-27 LAB — HEMOGLOBIN AND HEMATOCRIT, BLOOD
HCT: 34.1 % — ABNORMAL LOW (ref 39.0–52.0)
HCT: 34.6 % — ABNORMAL LOW (ref 39.0–52.0)
Hemoglobin: 11.3 g/dL — ABNORMAL LOW (ref 13.0–17.0)
Hemoglobin: 12.1 g/dL — ABNORMAL LOW (ref 13.0–17.0)

## 2023-03-27 LAB — MAGNESIUM: Magnesium: 2 mg/dL (ref 1.7–2.4)

## 2023-03-27 MED ORDER — SODIUM CHLORIDE 0.9 % IV SOLN: INTRAVENOUS | Status: DC

## 2023-03-27 MED ORDER — NITROGLYCERIN 0.4 MG SL SUBL
0.4000 mg | SUBLINGUAL_TABLET | SUBLINGUAL | Status: DC | PRN
  Administered 2023-03-27 (×3): 0.4 mg via SUBLINGUAL
  Filled 2023-03-27: qty 1

## 2023-03-27 MED ORDER — POTASSIUM CHLORIDE CRYS ER 20 MEQ PO TBCR
40.0000 meq | EXTENDED_RELEASE_TABLET | Freq: Once | ORAL | Status: AC
  Administered 2023-03-27: 40 meq via ORAL
  Filled 2023-03-27: qty 2

## 2023-03-27 MED ORDER — PANTOPRAZOLE SODIUM 40 MG IV SOLR
40.0000 mg | Freq: Two times a day (BID) | INTRAVENOUS | Status: DC
  Administered 2023-03-27: 40 mg via INTRAVENOUS
  Filled 2023-03-27: qty 10

## 2023-03-27 MED ORDER — LORAZEPAM 0.5 MG PO TABS: 0.5000 mg | ORAL_TABLET | ORAL | Status: DC | PRN

## 2023-03-27 MED ORDER — MORPHINE SULFATE (PF) 2 MG/ML IV SOLN
2.0000 mg | INTRAVENOUS | Status: DC | PRN
  Administered 2023-03-27 – 2023-03-28 (×3): 2 mg via INTRAVENOUS
  Filled 2023-03-27 (×3): qty 1

## 2023-03-27 MED ORDER — IOHEXOL 350 MG/ML SOLN
100.0000 mL | Freq: Once | INTRAVENOUS | Status: AC | PRN
  Administered 2023-03-27: 100 mL via INTRAVENOUS

## 2023-03-27 MED ORDER — LIDOCAINE VISCOUS HCL 2 % MT SOLN
15.0000 mL | Freq: Once | OROMUCOSAL | Status: AC
  Administered 2023-03-27: 15 mL via OROMUCOSAL
  Filled 2023-03-27: qty 15

## 2023-03-27 MED ORDER — PANTOPRAZOLE SODIUM 40 MG IV SOLR
40.0000 mg | Freq: Once | INTRAVENOUS | Status: AC
  Administered 2023-03-27: 40 mg via INTRAVENOUS
  Filled 2023-03-27: qty 10

## 2023-03-27 MED ORDER — NITROGLYCERIN 0.4 MG SL SUBL
SUBLINGUAL_TABLET | SUBLINGUAL | Status: AC
  Administered 2023-03-27: 0.4 mg via SUBLINGUAL
  Filled 2023-03-27: qty 1

## 2023-03-27 MED ORDER — FOLIC ACID 1 MG PO TABS
1.0000 mg | ORAL_TABLET | Freq: Every day | ORAL | Status: DC
  Administered 2023-03-27 – 2023-03-29 (×3): 1 mg via ORAL
  Filled 2023-03-27 (×3): qty 1

## 2023-03-27 MED ORDER — FENTANYL CITRATE PF 50 MCG/ML IJ SOSY
75.0000 ug | PREFILLED_SYRINGE | Freq: Once | INTRAMUSCULAR | Status: AC
  Administered 2023-03-27: 75 ug via INTRAVENOUS
  Filled 2023-03-27: qty 2

## 2023-03-27 MED ORDER — GABAPENTIN 300 MG PO CAPS
300.0000 mg | ORAL_CAPSULE | Freq: Three times a day (TID) | ORAL | Status: DC
  Administered 2023-03-27 – 2023-03-29 (×6): 300 mg via ORAL
  Filled 2023-03-27 (×6): qty 1

## 2023-03-27 MED ORDER — ASPIRIN 81 MG PO CHEW: 324.0000 mg | CHEWABLE_TABLET | Freq: Once | ORAL | Status: AC

## 2023-03-27 MED ORDER — ONDANSETRON HCL 4 MG/2ML IJ SOLN: 4.0000 mg | Freq: Four times a day (QID) | INTRAMUSCULAR | Status: DC | PRN

## 2023-03-27 MED ORDER — ENOXAPARIN SODIUM 40 MG/0.4ML IJ SOSY
40.0000 mg | PREFILLED_SYRINGE | INTRAMUSCULAR | Status: DC
  Administered 2023-03-27: 40 mg via SUBCUTANEOUS
  Filled 2023-03-27: qty 0.4

## 2023-03-27 MED ORDER — DEXTROSE 5 % IN LACTATED RINGERS IV BOLUS
1000.0000 mL | Freq: Once | INTRAVENOUS | Status: AC
  Administered 2023-03-27: 1000 mL via INTRAVENOUS
  Filled 2023-03-27: qty 1000

## 2023-03-27 MED ORDER — NITROGLYCERIN 0.4 MG SL SUBL
0.8000 mg | SUBLINGUAL_TABLET | Freq: Once | SUBLINGUAL | Status: AC
  Administered 2023-03-27: 0.8 mg via SUBLINGUAL

## 2023-03-27 MED ORDER — ADULT MULTIVITAMIN W/MINERALS CH
1.0000 | ORAL_TABLET | Freq: Every day | ORAL | Status: DC
  Administered 2023-03-27 – 2023-03-29 (×2): 1 via ORAL
  Filled 2023-03-27 (×2): qty 1

## 2023-03-27 MED ORDER — ASPIRIN 81 MG PO CHEW
CHEWABLE_TABLET | ORAL | Status: AC
  Administered 2023-03-27: 324 mg via ORAL
  Filled 2023-03-27: qty 4

## 2023-03-27 MED ORDER — AMLODIPINE BESYLATE 5 MG PO TABS
5.0000 mg | ORAL_TABLET | Freq: Every day | ORAL | Status: DC
  Administered 2023-03-27 – 2023-03-29 (×3): 5 mg via ORAL
  Filled 2023-03-27 (×3): qty 1

## 2023-03-27 MED ORDER — ALUM & MAG HYDROXIDE-SIMETH 200-200-20 MG/5ML PO SUSP
30.0000 mL | Freq: Once | ORAL | Status: AC
  Administered 2023-03-27: 30 mL via ORAL
  Filled 2023-03-27: qty 30

## 2023-03-27 MED ORDER — THIAMINE MONONITRATE 100 MG PO TABS
100.0000 mg | ORAL_TABLET | Freq: Every day | ORAL | Status: DC
  Administered 2023-03-27 – 2023-03-29 (×3): 100 mg via ORAL
  Filled 2023-03-27 (×3): qty 1

## 2023-03-27 MED ORDER — PEG 3350-KCL-NA BICARB-NACL 420 G PO SOLR
4000.0000 mL | Freq: Once | ORAL | Status: AC
  Administered 2023-03-27: 4000 mL via ORAL
  Filled 2023-03-27 (×2): qty 4000

## 2023-03-27 MED ORDER — LACTATED RINGERS IV SOLN: INTRAVENOUS | Status: DC

## 2023-03-27 MED ORDER — LORAZEPAM 1 MG PO TABS: 1.0000 mg | ORAL_TABLET | ORAL | Status: DC | PRN

## 2023-03-27 MED ORDER — THIAMINE HCL 100 MG/ML IJ SOLN: 100.0000 mg | Freq: Every day | INTRAMUSCULAR | Status: DC

## 2023-03-27 MED ORDER — POTASSIUM CHLORIDE 10 MEQ/100ML IV SOLN
10.0000 meq | INTRAVENOUS | Status: AC
  Administered 2023-03-27 (×4): 10 meq via INTRAVENOUS
  Filled 2023-03-27 (×2): qty 100

## 2023-03-27 MED ORDER — ACETAMINOPHEN 325 MG PO TABS
650.0000 mg | ORAL_TABLET | ORAL | Status: DC | PRN
  Administered 2023-03-27 – 2023-03-29 (×2): 650 mg via ORAL
  Filled 2023-03-27 (×2): qty 2

## 2023-03-27 NOTE — Progress Notes (Addendum)
Patient reports passing clots in his stool x 2 weeks.  Noted admission 2014 for upper GI bleeding in the setting of alcohol use.  Hemoglobin stable at 13.4. Case discussed with Dr. Servando Snare with gastroenterology who will formally evaluate the patient. On IV PPI  Trend hemoglobin Follow

## 2023-03-27 NOTE — ED Provider Notes (Signed)
[facility] Provider Note    Event Date/Time   First MD Initiated Contact with Patient 03/27/23 0302     (approximate)   History   Chest Pain   HPI  Ricardo Goodman is a 57 y.o. male who presents to the ED for evaluation of Chest Pain   I reviewed ED visit for chest pain on 7/14, and follow-up cardiology clinic visit from 7/19.  Had a negative CTA chest in the ED, coronary CT and echo scheduled for the future, not yet been performed.  Patient has a history of hypertension at baseline and GERD.  He presents to the ED for evaluation of chest pain that awoke him from sleep.  Reports being in his typical state of health when he went to bed last night, was awoken from sleep with severe left-sided and substernal chest pain with associated dyspnea.  No syncope, falls, cough or abdominal pain or back pain.  Reports pain is severe on arrival to the ED, his wife drives him by POV.  Pain improving throughout the triage process and with administration of nitroglycerin.  Physical Exam   Triage Vital Signs: ED Triage Vitals  Encounter Vitals Group     BP --      Systolic BP Percentile --      Diastolic BP Percentile --      Pulse Rate 03/27/23 0301 87     Resp 03/27/23 0301 18     Temp --      Temp src --      SpO2 03/27/23 0301 99 %     Weight 03/27/23 0300 201 lb 1 oz (91.2 kg)     Height 03/27/23 0300 5\' 8"  (1.727 m)     Head Circumference --      Peak Flow --      Pain Score 03/27/23 0300 10     Pain Loc --      Pain Education --      Exclude from Growth Chart --     Most recent vital signs: Vitals:   03/27/23 0530 03/27/23 0600  BP: 118/82 125/85  Pulse: 69 (!) 51  Resp: 20 16  SpO2: 100% 92%    General: Awake, no distress.  CV:  Good peripheral perfusion.  Resp:  Normal effort.  Abd:  No distention.  MSK:  No deformity noted.  Neuro:  No focal deficits appreciated. Other:     ED Results / Procedures / Treatments   Labs (all labs  ordered are listed, but only abnormal results are displayed) Labs Reviewed  COMPREHENSIVE METABOLIC PANEL - Abnormal; Notable for the following components:      Result Value   Potassium 3.0 (*)    CO2 20 (*)    Glucose, Bld 101 (*)    AST 100 (*)    ALT 60 (*)    Anion gap 17 (*)    All other components within normal limits  CBC WITH DIFFERENTIAL/PLATELET - Abnormal; Notable for the following components:   RBC 3.94 (*)    MCV 100.5 (*)    All other components within normal limits  ETHANOL - Abnormal; Notable for the following components:   Alcohol, Ethyl (B) 323 (*)    All other components within normal limits  BRAIN NATRIURETIC PEPTIDE  MAGNESIUM  PROTIME-INR  APTT  LIPASE, BLOOD  URINE DRUG SCREEN, QUALITATIVE ([facility] ONLY)  TROPONIN I (HIGH SENSITIVITY)  TROPONIN I (HIGH SENSITIVITY)    EKG Sinus rhythm with a  rate of 66 bpm.  Normal axis and intervals.  Nonspecific ST changes without clear signs of acute ischemia. While the baseline of his EKG is tremulous, he does seem to have some slight nonspecific ST changes that are different from an EKG a few weeks ago in the ED, including T wave inversions to lead V4, V3 possibly lead II.  The treatment is baseline throughout some of this EKG is is difficult to interpret these minor features  RADIOLOGY CXR interpreted by me without evidence of acute cardiopulmonary pathology.  Official radiology report(s): DG Chest Portable 1 View  Result Date: 03/27/2023 CLINICAL DATA:  Left-sided chest pain. EXAM: PORTABLE CHEST 1 VIEW COMPARISON:  March 05, 2023 FINDINGS: The heart size and mediastinal contours are within normal limits. Mild atelectasis and/or infiltrate is seen within the bilateral lung bases. No pleural effusion or pneumothorax are identified. The visualized skeletal structures are unremarkable. IMPRESSION: Mild bibasilar atelectasis and/or infiltrate. Electronically Signed   By: Aram Candela M.D.   On: 03/27/2023 03:29     PROCEDURES and INTERVENTIONS:  .1-3 Lead EKG Interpretation  Performed by: Delton Prairie, MD Authorized by: Delton Prairie, MD     Interpretation: normal     ECG rate:  80   ECG rate assessment: normal     Rhythm: sinus rhythm     Ectopy: none     Conduction: normal     Medications  nitroGLYCERIN (NITROSTAT) SL tablet 0.4 mg (0.4 mg Sublingual Given 03/27/23 0527)  pantoprazole (PROTONIX) injection 40 mg (has no administration in time range)  aspirin chewable tablet 324 mg (324 mg Oral Given 03/27/23 0306)  potassium chloride SA (KLOR-CON M) CR tablet 40 mEq (40 mEq Oral Given 03/27/23 0428)  dextrose 5% lactated ringers bolus 1,000 mL (0 mLs Intravenous Stopped 03/27/23 0545)  alum & mag hydroxide-simeth (MAALOX/MYLANTA) 200-200-20 MG/5ML suspension 30 mL (30 mLs Oral Given 03/27/23 0435)  lidocaine (XYLOCAINE) 2 % viscous mouth solution 15 mL (15 mLs Mouth/Throat Given 03/27/23 0435)  fentaNYL (SUBLIMAZE) injection 75 mcg (75 mcg Intravenous Given 03/27/23 0544)     IMPRESSION / MDM / ASSESSMENT AND PLAN / ED COURSE  I reviewed the triage vital signs and the nursing notes.  Differential diagnosis includes, but is not limited to, ACS, PTX, PNA, muscle strain/spasm, PE, dissection, anxiety, pleural effusion  {Patient presents with symptoms of an acute illness or injury that is potentially life-threatening.   Patient presents to the ED with chest pain that awoke him from sleep.  Has send T wave inversions and changes from his EKG 3 weeks ago but his troponins are negative x 2.  He initially failed to tell us about his ethanol ingestion or hematochezia, but a gastric etiology of his symptoms are certainly a possibility.  He does have some improvement with GI cocktail and viscous lidocaine, but the pain recurs on multiple occasions in the ED despite various treatments.  Metabolic panel with hypokalemia, mild transaminitis and anion gap suggestive of a mild case of alcoholic ketoacidosis.  Due to  his continued intermittent pain that is poorly controlled, we will consult with medicine for admission.  Clinical Course as of 03/27/23 0626  Mon Mar 27, 2023  0307 Pain improving.  Aspirin and nitro provided. [DS]  4098 Reassessed, still feeling better.  Wife at the bedside [DS]  0357 reassessed [DS]  0429 Calling out due to worsening chest pain.  We will try another nitro tablets [DS]  0450 Reassessed. Feeling better [DS]  0610 Reassessed and  discussed flat troponins.  He now tells me that he has been bleeding out of his rectum, where he failed to tell me this before.  We discussed disposition and he is agreeable to stay for admission for observation. [DS]    Clinical Course User Index [DS] Delton Prairie, MD     FINAL CLINICAL IMPRESSION(S) / ED DIAGNOSES   Final diagnoses:  Other chest pain  Alcohol abuse  Hematochezia  Hypokalemia     Rx / DC Orders   ED Discharge Orders     None        Note:  This document was prepared using Dragon voice recognition software and may include unintentional dictation errors.   Delton Prairie, MD 03/27/23 [phone]

## 2023-03-27 NOTE — ED Triage Notes (Signed)
Pt presents ambulatory to triage via POV with complaints of L sided CP that started tonight - pt walked into the lobby and sat down and had a near syncopal episode. Pt escorted to room 4 by this RN, Eliberto Ivory, RN and Ala Dach, EDT. He states that the pain woke him out of his sleep. No hx of MI. A&Ox4 at this time. Denies SOB.

## 2023-03-27 NOTE — Assessment & Plan Note (Addendum)
Heavy ETOH use including 6pk of beer, 1 pint vodka daily  ETOH level 323 Start CIWA protocol  Pt reports being ready to quit

## 2023-03-27 NOTE — Consult Note (Signed)
Cardiology Consultation   Patient ID: MCKINNEY POPLAR MRN: [id number]; DOB: Mar 24, 1966  Admit date: 03/27/2023 Date of Consult: 03/27/2023  PCP:  [facility]   [facility] Providers Cardiologist:  Debbe Odea, MD   {   Patient Profile:   Ricardo Goodman is a 57 y.o. male with a hx of alcohol abuse, HTN, tobacco use who is being seen 03/27/2023 for the evaluation of chest pain at the request of Dr. Para March.  History of Present Illness:   Ricardo Goodman was seen by Dr. Azucena Cecil in 02/2023 as a new patient for chest pain that was non-exertional. He was previously evaluated in the ER recently with negative work-up. Echo and Cardiac CTA were ordered. Patient was started on PPI.   Patient reports he drinks a 6 pack of beer daily. He smokes a cigar weekly.  The patient presented to the ER 03/27/23 with chest pain. Patient reports chest pain for the last month. Says it's constant, but intermittently gets worse. Pain is not worse with exertion. Says it woke him up the night before presentation. He felt sweaty and had SOB. Pain was 10/10 and radiating down the arm. He took SL NTG with minimal improvement. Says he drank 6 pack of beer and a pint of liquor earlier in the day. Patient's wife brought him to the ER.  In the ER BP 118/82, pulse 69bpm, 100% O2, RR 20. Labs showed K3.0, CO2, 20, BG 101, AST 100, ALT 60, alcohol level 323. BNP 12.9. HS trop 16>12. EKG showed NSR with nonspecific T wave changes.UDS negative. CXR negative.He reports active 7/10 chest pain during interview.   Past Medical History:  Diagnosis Date   DVT (deep venous thrombosis) (HCC)    Gastric ulcer 1990   Hypertension     Past Surgical History:  Procedure Laterality Date   ANKLE SURGERY     gastric ulcer surgery  1999   ORIF PROXIMAL TIBIOFIBULAR JOINT       Home Medications:  Prior to Admission medications   Medication Sig Start Date End Date Taking? Authorizing Provider   albuterol (VENTOLIN HFA) 108 (90 Base) MCG/ACT inhaler Inhale 2 puffs into the lungs every 4 (four) hours as needed for wheezing or shortness of breath. 03/05/23  Yes Irean Hong, MD  amLODipine (NORVASC) 5 MG tablet Take 1 tablet (5 mg total) by mouth daily. 01/29/23 01/29/24 Yes Dionne Bucy, MD  cyclobenzaprine (FLEXERIL) 5 MG tablet Take 1 tablet (5 mg total) by mouth 3 (three) times daily as needed for muscle spasms. 01/06/15  Yes Menshew, Charlesetta Ivory, PA-C  gabapentin (NEURONTIN) 300 MG capsule Take 300 mg by mouth 3 (three) times daily.   Yes [provider]  omeprazole (PRILOSEC) 20 MG capsule Take 1 capsule (20 mg total) by mouth daily. 03/10/23  Yes Agbor-Etang, Arlys John, MD  ibuprofen (ADVIL,MOTRIN) 100 MG/5ML suspension Take 200 mg by mouth every 4 (four) hours as needed for mild pain.  Patient not taking: Reported on 03/10/2023    [provider]  ketorolac (TORADOL) 10 MG tablet Take 1 tablet (10 mg total) by mouth every 8 (eight) hours. Patient not taking: Reported on 03/10/2023 01/06/15   Menshew, Charlesetta Ivory, PA-C  predniSONE (DELTASONE) 20 MG tablet 3 tablets daily x 5 days Patient not taking: Reported on 03/10/2023 03/05/23   Irean Hong, MD    Inpatient Medications: Scheduled Meds:  amLODipine  5 mg Oral Daily   enoxaparin (LOVENOX) injection  40  mg Subcutaneous Q24H   folic acid  1 mg Oral Daily   gabapentin  300 mg Oral TID   multivitamin with minerals  1 tablet Oral Daily   pantoprazole (PROTONIX) IV  40 mg Intravenous Q12H   thiamine  100 mg Oral Daily   Or   thiamine  100 mg Intravenous Daily   Continuous Infusions:  lactated ringers     potassium chloride     PRN Meds: acetaminophen, LORazepam **OR** LORazepam, morphine injection, nitroGLYCERIN, ondansetron (ZOFRAN) IV  Allergies:   No Known Allergies  Social History:   Social History   Socioeconomic History   Marital status: Married    Spouse name: Not on file   Number of  children: Not on file   Years of education: Not on file   Highest education level: Not on file  Occupational History   Not on file  Tobacco Use   Smoking status: Never   Smokeless tobacco: Never  Vaping Use   Vaping status: Never Used  Substance and Sexual Activity   Alcohol use: Yes    Comment: occ   Drug use: No   Sexual activity: Yes  Other Topics Concern   Not on file  Social History Narrative   Not on file   Social Determinants of Health   Financial Resource Strain: Not on file  Food Insecurity: No Food Insecurity (03/27/2023)   Hunger Vital Sign    Worried About Running Out of Food in the Last Year: Never true    Ran Out of Food in the Last Year: Never true  Transportation Needs: No Transportation Needs (03/27/2023)   PRAPARE - Administrator, Civil Service (Medical): No    Lack of Transportation (Non-Medical): No  Physical Activity: Not on file  Stress: Not on file  Social Connections: Not on file  Intimate Partner Violence: Not At Risk (03/27/2023)   Humiliation, Afraid, Rape, and Kick questionnaire    Fear of Current or Ex-Partner: No    Emotionally Abused: No    Physically Abused: No    Sexually Abused: No    Family History:   History reviewed. No pertinent family history.   ROS:  Please see the history of present illness.   All other ROS reviewed and negative.     Physical Exam/Data:   Vitals:   03/27/23 0600 03/27/23 0630 03/27/23 0700 03/27/23 0730  BP: 125/85 96/61 114/78 125/85  Pulse: (!) 51 (!) 49 (!) 53 (!) 54  Resp: 16 16 14 12   TempSrc:      SpO2: 92% 94% 96% 95%  Weight:      Height:       No intake or output data in the 24 hours ending 03/27/23 0834    03/27/2023    3:00 AM 03/10/2023   10:24 AM 03/05/2023   12:40 AM  Last 3 Weights  Weight (lbs) 201 lb 1 oz 201 lb 3.2 oz 200 lb  Weight (kg) 91.2 kg 91.264 kg 90.719 kg     Body mass index is 30.57 kg/m.  General:  Well nourished, well developed, in no acute  distress HEENT: normal Neck: no JVD Vascular: No carotid bruits; Distal pulses 2+ bilaterally Cardiac:  normal S1, S2; RRR; no murmur  Lungs:  clear to auscultation bilaterally, no wheezing, rhonchi or rales  Abd: soft, nontender, no hepatomegaly  Ext: no edema Musculoskeletal:  No deformities, BUE and BLE strength normal and equal Skin: warm and dry  Neuro:  CNs 2-12  intact, no focal abnormalities noted Psych:  Normal affect   EKG:  The EKG was personally reviewed and demonstrates:  NSR 75bpm, nonspecific T wave changes Telemetry:  Telemetry was personally reviewed and demonstrates:  SB 50s  Relevant CV Studies:  Echo and Cardiac CTA ordered  Laboratory Data:  High Sensitivity Troponin:   Recent Labs  Lab 03/05/23 0046 03/05/23 0244 03/27/23 0318 03/27/23 0510  TROPONINIHS 11 12 16 12      Chemistry Recent Labs  Lab 03/27/23 0318  NA 144  K 3.0*  CL 107  CO2 20*  GLUCOSE 101*  BUN 8  CREATININE 0.95  CALCIUM 9.6  MG 2.0  GFRNONAA >60  ANIONGAP 17*    Recent Labs  Lab 03/27/23 0318  PROT 8.0  ALBUMIN 4.9  AST 100*  ALT 60*  ALKPHOS 86  BILITOT 0.8   Lipids No results for input(s): "CHOL", "TRIG", "HDL", "LABVLDL", "LDLCALC", "CHOLHDL" in the last 168 hours.  Hematology Recent Labs  Lab 03/27/23 0318  WBC 8.3  RBC 3.94*  HGB 13.4  HCT 39.6  MCV 100.5*  MCH 34.0  MCHC 33.8  RDW 12.9  PLT 246   Thyroid No results for input(s): "TSH", "FREET4" in the last 168 hours.  BNP Recent Labs  Lab 03/27/23 0318  BNP 12.9    DDimer No results for input(s): "DDIMER" in the last 168 hours.   Radiology/Studies:  DG Chest Portable 1 View  Result Date: 03/27/2023 CLINICAL DATA:  Left-sided chest pain. EXAM: PORTABLE CHEST 1 VIEW COMPARISON:  March 05, 2023 FINDINGS: The heart size and mediastinal contours are within normal limits. Mild atelectasis and/or infiltrate is seen within the bilateral lung bases. No pleural effusion or pneumothorax are  identified. The visualized skeletal structures are unremarkable. IMPRESSION: Mild bibasilar atelectasis and/or infiltrate. Electronically Signed   By: Aram Candela M.D.   On: 03/27/2023 03:29     Assessment and Plan:   Atypical chest pain - patient presents with atypical chest pain. He was undergoing outpatient work-up for the same pain - HS trop negative - EKG with nonspecific T wave changes - reports active chest pain, 7/10 - plan for echo and Cardiac CTA - check lipids and A1c  HTN - PTA amlodipine 5mg  daily - BPS wnl  Alcohol abuse - patient drinks 6 pack daily - CIWA per IM - cessation advised  BRBPR - he reports clots with BMs - H&H stable  For questions or updates, please contact [facility] Please consult [email or web address] for contact info under    Signed, Myna Freimark David Stall, PA-C  03/27/2023 8:34 AM

## 2023-03-27 NOTE — H&P (Addendum)
History and Physical    Patient: Ricardo Goodman DOB: 1966/06/04 DOA: 03/27/2023 DOS: the patient was seen and examined on 03/27/2023 PCP: [facility]  Patient coming from: Home  Chief Complaint:  Chief Complaint  Patient presents with   Chest Pain   HPI: Ricardo Goodman is a 57 y.o. male with medical history significant of alcohol abuse, hypertension, tobacco use presenting with chest pain,GI bleeding. ETOH abuse.  Patient reports recurring episodes of central chest pain with radiation up the neck and down the left arm over multiple weeks.  Chest pain seems to be progressively worsening per patient.  Has had episodes with pain wakes him up from sleep.  Moderate to severe in intensity.  Baseline heavy alcohol use drinking up to 5 beers as well as 1 pint of brandy daily.  Does report some minimal to mild upper abdominal pain at times.  No shortness of breath.  No nausea or vomiting.  No vomiting or diarrhea.  Was seen on the ER July 14 for chest pain with negative CTA of the chest then had follow-up on July 17 with Dr. Azucena Cecil with cardiology.  Patient was tentatively scheduled for 2D echo and coronary CTA.  Patient did not follow-up on these appointments. Presented to the ER afebrile, hemodynamically stable.  Satting well on room air.  Troponin negative x 2.  EKG normal sinus rhythm with some nonspecific ST and T wave changes.  Labs grossly within normal limits apart from alcohol level of 323 and a potassium of 3.0.  Chest x-ray stable.   Review of Systems: As mentioned in the history of present illness. All other systems reviewed and are negative. Past Medical History:  Diagnosis Date   DVT (deep venous thrombosis) (HCC)    Gastric ulcer 1990   Hypertension    Past Surgical History:  Procedure Laterality Date   ANKLE SURGERY     gastric ulcer surgery  1999   ORIF PROXIMAL TIBIOFIBULAR JOINT     Social History:  reports that he has never smoked. He has  never used smokeless tobacco. He reports current alcohol use. He reports that he does not use drugs.  No Known Allergies  History reviewed. No pertinent family history.  Prior to Admission medications   Medication Sig Start Date End Date Taking? Authorizing Provider  albuterol (VENTOLIN HFA) 108 (90 Base) MCG/ACT inhaler Inhale 2 puffs into the lungs every 4 (four) hours as needed for wheezing or shortness of breath. 03/05/23   Irean Hong, MD  amLODipine (NORVASC) 5 MG tablet Take 1 tablet (5 mg total) by mouth daily. 01/29/23 01/29/24  Dionne Bucy, MD  cyclobenzaprine (FLEXERIL) 5 MG tablet Take 1 tablet (5 mg total) by mouth 3 (three) times daily as needed for muscle spasms. 01/06/15   Menshew, Charlesetta Ivory, PA-C  gabapentin (NEURONTIN) 300 MG capsule Take 300 mg by mouth 3 (three) times daily.    [provider]  ibuprofen (ADVIL,MOTRIN) 100 MG/5ML suspension Take 200 mg by mouth every 4 (four) hours as needed for mild pain.  Patient not taking: Reported on 03/10/2023    [provider]  ketorolac (TORADOL) 10 MG tablet Take 1 tablet (10 mg total) by mouth every 8 (eight) hours. Patient not taking: Reported on 03/10/2023 01/06/15   Menshew, Charlesetta Ivory, PA-C  omeprazole (PRILOSEC) 20 MG capsule Take 1 capsule (20 mg total) by mouth daily. 03/10/23   Debbe Odea, MD  predniSONE (DELTASONE) 20 MG tablet  3 tablets daily x 5 days Patient not taking: Reported on 03/10/2023 03/05/23   Irean Hong, MD    Physical Exam: Vitals:   03/27/23 0600 03/27/23 0630 03/27/23 0700 03/27/23 0730  BP: 125/85 96/61 114/78 125/85  Pulse: (!) 51 (!) 49 (!) 53 (!) 54  Resp: 16 16 14 12   TempSrc:      SpO2: 92% 94% 96% 95%  Weight:      Height:       Physical Exam Constitutional:      Appearance: He is normal weight.  HENT:     Head: Normocephalic and atraumatic.     Nose: Nose normal.     Mouth/Throat:     Mouth: Mucous membranes are moist.  Eyes:     Pupils: Pupils  are equal, round, and reactive to light.  Cardiovascular:     Rate and Rhythm: Normal rate and regular rhythm.  Pulmonary:     Effort: Pulmonary effort is normal.  Abdominal:     General: Bowel sounds are normal.  Musculoskeletal:        General: Normal range of motion.  Skin:    General: Skin is warm.  Neurological:     General: No focal deficit present.     Data Reviewed:  There are no new results to review at this time. DG Chest Portable 1 View CLINICAL DATA:  Left-sided chest pain.  EXAM: PORTABLE CHEST 1 VIEW  COMPARISON:  March 05, 2023  FINDINGS: The heart size and mediastinal contours are within normal limits. Mild atelectasis and/or infiltrate is seen within the bilateral lung bases. No pleural effusion or pneumothorax are identified. The visualized skeletal structures are unremarkable.  IMPRESSION: Mild bibasilar atelectasis and/or infiltrate.  Electronically Signed   By: Aram Candela M.D.   On: 03/27/2023 03:29  Lab Results  Component Value Date   WBC 8.3 03/27/2023   HGB 13.4 03/27/2023   HCT 39.6 03/27/2023   MCV 100.5 (H) 03/27/2023   PLT 246 03/27/2023   Last metabolic panel Lab Results  Component Value Date   GLUCOSE 101 (H) 03/27/2023   NA 144 03/27/2023   K 3.0 (L) 03/27/2023   CL 107 03/27/2023   CO2 20 (L) 03/27/2023   BUN 8 03/27/2023   CREATININE 0.95 03/27/2023   GFRNONAA >60 03/27/2023   CALCIUM 9.6 03/27/2023   PROT 8.0 03/27/2023   ALBUMIN 4.9 03/27/2023   BILITOT 0.8 03/27/2023   ALKPHOS 86 03/27/2023   AST 100 (H) 03/27/2023   ALT 60 (H) 03/27/2023   ANIONGAP 17 (H) 03/27/2023    Assessment and Plan: * Unstable angina (HCC) Recurrent typical chest pain concerning for unstable angina Troponin negative x 2 EKG sinus rhythm with nonspecific ST and T wave changes Heart score around 5 Noted to have been recently evaluated with cardiology in the outpatient setting with plan for 2D echo and coronary CT Will order 2D  echo while in house Formally consult cardiology to determine further imaging versus stress testing Differential diagnosis also includes gastritis in the setting of alcohol use as patient does have some symptomatic improvement after a GI cocktail Risk stratification labs Status post full dose aspirin PRN morphine and nitroglycerin Follow-up formal cardiology recommendations  ETOH abuse Heavy ETOH use including 6pk of beer, 1 pint vodka daily  ETOH level 323 Start CIWA protocol  Pt reports being ready to quit   GI bleeding Patient reports passing clots in his stool x 2 weeks.  Noted admission 2014 for  upper GI bleeding in the setting of alcohol use.  Hemoglobin stable at 13.4. Case discussed with Dr. Servando Snare with gastroenterology who will formally evaluate the patient. On IV PPI  Trend hemoglobin Follow  HTN (hypertension) BP stable  Titrate home regimen        Advance Care Planning:   Code Status: Full Code   Consults: Cardiology  Family Communication: No family at the bedside   Severity of Illness: The appropriate patient status for this patient is OBSERVATION. Observation status is judged to be reasonable and necessary in order to provide the required intensity of service to ensure the patient's safety. The patient's presenting symptoms, physical exam findings, and initial radiographic and laboratory data in the context of their medical condition is felt to place them at decreased risk for further clinical deterioration. Furthermore, it is anticipated that the patient will be medically stable for discharge from the hospital within 2 midnights of admission.   Author: Floydene Flock, MD 03/27/2023 8:19 AM  For on call review [email or web address].

## 2023-03-27 NOTE — TOC Initial Note (Signed)
Transition of Care [facility]) - Initial/Assessment Note    Patient Details  Name: Ricardo Goodman MRN: [id number] Date of Birth: 1966-03-21  Transition of Care [facility]) CM/SW Contact:    Kreg Shropshire, RN Phone Number: 03/27/2023, 12:35 PM  Clinical Narrative:                  Cm received TOC consult for substance abuse. Pt declined any resources for substance use at this time.   Pt arrived from ED from: Home Caregiver Support: Wife DME at Home: None Transportation: Family Previous Services: None HH/SNF Preference: None First Person of Contact:  PCP: [employer]   Cm will continue to follow for toc needs and d/c planning.        Patient Goals and CMS Choice   CMS Medicare.gov Compare Post Acute Care list provided to:: Patient Choice offered to / list presented to : Patient      Expected Discharge Plan and Services     Post Acute Care Choice: Home Health, Skilled Nursing Facility Living arrangements for the past 2 months: Single Family Home                                      Prior Living Arrangements/Services Living arrangements for the past 2 months: Single Family Home Lives with:: Self, Spouse                   Activities of Daily Living Home Assistive Devices/Equipment: None ADL Screening (condition at time of admission) Patient's cognitive ability adequate to safely complete daily activities?: Yes Is the patient deaf or have difficulty hearing?: No Does the patient have difficulty seeing, even when wearing glasses/contacts?: No Does the patient have difficulty concentrating, remembering, or making decisions?: No Patient able to express need for assistance with ADLs?: Yes Does the patient have difficulty dressing or bathing?: No Independently performs ADLs?: Yes (appropriate for developmental age) Does the patient have difficulty walking or climbing stairs?: No Weakness of Legs: None Weakness of Arms/Hands: None  Permission  Sought/Granted                  Emotional Assessment   Attitude/Demeanor/Rapport: Engaged Affect (typically observed): Calm Orientation: : Oriented to Self, Oriented to Place, Oriented to  Time, Oriented to Situation Alcohol / Substance Use: Illicit Drugs (pt denied any use nor resources)    Admission diagnosis:  Unstable angina (HCC) [I20.0] Patient Active Problem List   Diagnosis Date Noted   Unstable angina (HCC) 03/27/2023   ETOH abuse 03/27/2023   HTN (hypertension) 03/27/2023   GI bleeding 03/27/2023   PCP:  [facility] Pharmacy:   [facility] [address]), [place] - [address] [place]) [place] [address] Phone: [phone] Fax: [phone]     Social Determinants of Health (SDOH) Social History: SDOH Screenings   Food Insecurity: No Food Insecurity (03/27/2023)  Housing: Low Risk  (03/27/2023)  Transportation Needs: No Transportation Needs (03/27/2023)  Utilities: Not At Risk (03/27/2023)  Tobacco Use: Low Risk  (03/27/2023)   SDOH Interventions:     Readmission Risk Interventions     No data to display

## 2023-03-27 NOTE — Plan of Care (Signed)
  Problem: Education: Goal: Knowledge of General Education information will improve Description: Including pain rating scale, medication(s)/side effects and non-pharmacologic comfort measures Outcome: Progressing   Problem: Clinical Measurements: Goal: Ability to maintain clinical measurements within normal limits will improve Outcome: Progressing   Problem: Clinical Measurements: Goal: Respiratory complications will improve Outcome: Progressing   Problem: Clinical Measurements: Goal: Cardiovascular complication will be avoided Outcome: Progressing   Problem: Activity: Goal: Risk for activity intolerance will decrease Outcome: Progressing   Problem: Pain Managment: Goal: General experience of comfort will improve Outcome: Progressing   Problem: Safety: Goal: Ability to remain free from injury will improve Outcome: Progressing   Problem: Skin Integrity: Goal: Risk for impaired skin integrity will decrease Outcome: Progressing   Problem: Cardiac: Goal: Ability to achieve and maintain adequate cardiovascular perfusion will improve Outcome: Progressing   Problem: Activity: Goal: Ability to tolerate increased activity will improve Outcome: Progressing

## 2023-03-27 NOTE — Progress Notes (Signed)
Pt states not having pain or dizziness, pt instructed on drinking extra fluids today to flush out his kidneys and discussion had regarding him getting iv fluids, pt verbalized understanding, pt taken back to room 31 in the ER via w/c with ct tech [place]

## 2023-03-27 NOTE — Assessment & Plan Note (Signed)
Recurrent typical chest pain concerning for unstable angina Troponin negative x 2 EKG sinus rhythm with nonspecific ST and T wave changes Heart score around 5 Noted to have been recently evaluated with cardiology in the outpatient setting with plan for 2D echo and coronary CT Will order 2D echo while in house Formally consult cardiology to determine further imaging versus stress testing Differential diagnosis also includes gastritis in the setting of alcohol use as patient does have some symptomatic improvement after a GI cocktail Risk stratification labs Status post full dose aspirin PRN morphine and nitroglycerin Follow-up formal cardiology recommendations

## 2023-03-27 NOTE — Consult Note (Signed)
Ricardo Minium, MD [facility]  [address] [place], [place] [address] Phone: [phone] Fax : [phone]  Consultation  Referring Provider:     Dr. Alvester Morin Primary Care Physician:  [facility], [facility] Primary Gastroenterologist: [name]         Reason for Consultation:     GI bleeding  Date of Admission:  03/27/2023 Date of Consultation:  03/27/2023         HPI:   Ricardo Goodman is a 57 y.o. male who was admitted with chest pain.  The patient has a history of alcohol abuse with 6 beers a day usually 6 days a week.  He states he does not drink on Sunday.  The patient denies a history of GI bleeding in the past but states that he has been having rectal bleeding.  He states that the rectal bleeding consists of passing clots for the last 2 weeks.  The patient's hemoglobin 3 weeks ago was 12.5 with his admission hemoglobin of 13.4 and this morning his hemoglobin was 11.3.  The patient does have some vague abdominal pain in multiple areas of his abdomen. It appears that the patient was seen in 2012 for a GI bleed with hematochezia and hematemesis.  At that time the patient was seen by Dr. [name].  An upper endoscopy was referenced by his gastroenterologist at that time.  Report is not accessible but the pathology on biopsies of stomach showed:  Diagnosis:  GASTRIC ANTRUM COLD BIOPSY:  - HELICOBACTER PYLORI-ASSOCIATED CHRONIC GASTRITIS OF ANTRAL  MUCOSA, WITH MODERATE CHRONIC INFLAMMATION AND MINIMAL ACTIVITY.  - PATCHY INTESTINAL METAPLASIA.  - NEGATIVE FOR DYSPLASIA AND MALIGNANCY.  - A FEW H. PYLORI SEEN ON DIFF-QUIK STAIN.   The patient had a CT angiography for a PE back in July of this year and was found to have severe hepatic steatosis with bronchitis in the upper and more so lower lobes.  Past Medical History:  Diagnosis Date   DVT (deep venous thrombosis) (HCC)    Gastric ulcer 1990   Hypertension     Past Surgical History:  Procedure Laterality Date    ANKLE SURGERY     gastric ulcer surgery  1999   ORIF PROXIMAL TIBIOFIBULAR JOINT      Prior to Admission medications   Medication Sig Start Date End Date Taking? Authorizing Provider  albuterol (VENTOLIN HFA) 108 (90 Base) MCG/ACT inhaler Inhale 2 puffs into the lungs every 4 (four) hours as needed for wheezing or shortness of breath. 03/05/23  Yes Irean Hong, MD  amLODipine (NORVASC) 5 MG tablet Take 1 tablet (5 mg total) by mouth daily. 01/29/23 01/29/24 Yes Dionne Bucy, MD  cyclobenzaprine (FLEXERIL) 5 MG tablet Take 1 tablet (5 mg total) by mouth 3 (three) times daily as needed for muscle spasms. 01/06/15  Yes Menshew, Charlesetta Ivory, PA-C  gabapentin (NEURONTIN) 300 MG capsule Take 300 mg by mouth 3 (three) times daily.   Yes [provider]  omeprazole (PRILOSEC) 20 MG capsule Take 1 capsule (20 mg total) by mouth daily. 03/10/23  Yes Agbor-Etang, Arlys John, MD  ibuprofen (ADVIL,MOTRIN) 100 MG/5ML suspension Take 200 mg by mouth every 4 (four) hours as needed for mild pain.  Patient not taking: Reported on 03/10/2023    [provider]  ketorolac (TORADOL) 10 MG tablet Take 1 tablet (10 mg total) by mouth every 8 (eight) hours. Patient not taking: Reported on 03/10/2023 01/06/15   Menshew, Charlesetta Ivory,  PA-C  predniSONE (DELTASONE) 20 MG tablet 3 tablets daily x 5 days Patient not taking: Reported on 03/10/2023 03/05/23   Irean Hong, MD    History reviewed. No pertinent family history.   Social History   Tobacco Use   Smoking status: Never   Smokeless tobacco: Never  Vaping Use   Vaping status: Never Used  Substance Use Topics   Alcohol use: Yes    Comment: occ   Drug use: No    Allergies as of 03/27/2023   (No Known Allergies)    Review of Systems:    All systems reviewed and negative except where noted in HPI.   Physical Exam:  Vital signs in last 24 hours: Temp:  [97.7 F (36.5 C)] 97.7 F (36.5 C) (08/05 0940) Pulse Rate:  [48-87] 58 (08/05  1313) Resp:  [12-20] 16 (08/05 1249) BP: (95-129)/(61-87) 117/67 (08/05 1313) SpO2:  [92 %-100 %] 98 % (08/05 1249) Weight:  [91.2 kg] 91.2 kg (08/05 0300)   General:   Pleasant, cooperative in NAD Head:  Normocephalic and atraumatic. Eyes:   No icterus.   Conjunctiva pink. PERRLA. Ears:  Normal auditory acuity. Neck:  Supple; no masses or thyroidomegaly Lungs: Respirations even and unlabored. Lungs clear to auscultation bilaterally.   No wheezes, crackles, or rhonchi.  Heart:  Regular rate and rhythm;  Without murmur, clicks, rubs or gallops Abdomen:  Soft, nondistended, nontender. Normal bowel sounds. No appreciable masses or hepatomegaly.  No rebound or guarding.  Rectal:  Not performed. Msk:  Symmetrical without gross deformities.   Extremities:  Without edema, cyanosis or clubbing. Neurologic:  Alert and oriented x3;  grossly normal neurologically. Skin:  Intact without significant lesions or rashes. Cervical Nodes:  No significant cervical adenopathy. Psych:  Alert and cooperative. Normal affect.  LAB RESULTS: Recent Labs    03/27/23 0318 03/27/23 1051  WBC 8.3  --   HGB 13.4 11.3*  HCT 39.6 34.1*  PLT 246  --    BMET Recent Labs    03/27/23 0318  NA 144  K 3.0*  CL 107  CO2 20*  GLUCOSE 101*  BUN 8  CREATININE 0.95  CALCIUM 9.6   LFT Recent Labs    03/27/23 0318  PROT 8.0  ALBUMIN 4.9  AST 100*  ALT 60*  ALKPHOS 86  BILITOT 0.8   PT/INR Recent Labs    03/27/23 0318  LABPROT 12.7  INR 0.9    STUDIES: CT CORONARY MORPH W/CTA COR W/SCORE W/CA W/CM &/OR WO/CM  Result Date: 03/27/2023 CLINICAL DATA:  Chest pain EXAM: Cardiac/Coronary  CTA TECHNIQUE: The patient was scanned on a Siemens Somatom force scanner. : A retrospective scan was triggered in the ascending thoracic aorta. Axial non-contrast 3 mm slices were carried out through the heart. The data set was analyzed on a dedicated work station and scored using the Agatson method. Gantry rotation  speed was 66 msecs and collimation was .6 mm. 0.8 mg of sl NTG was given. The 3D data set was reconstructed in 5% intervals of the 60-95 % of the R-R cycle. Diastolic phases were analyzed on a dedicated work station using MPR, MIP and VRT modes. The patient received 85 cc of contrast. FINDINGS: Aorta:  Normal size.  No calcifications.  No dissection. Aortic Valve:  Trileaflet.  No calcifications. Coronary Arteries:  Normal coronary origin. left dominance. RCA is a dominant artery. There is no plaque. Left main gives rise to LAD and LCX arteries. LM has no disease.  LAD has no plaque. LAD wraps around the apex and supplies posterior AV groove. LCX is a dominant artery.  There is no plaque. Other findings: Normal pulmonary vein drainage into the left atrium. Normal left atrial appendage without a thrombus. Normal size of the pulmonary artery. IMPRESSION: 1. Coronary calcium score of 0. 2. Normal coronary origin with left dominance. 3. No evidence of CAD. 4. CAD-RADS 0. No evidence of CAD (0%). Consider non-atherosclerotic causes of chest pain. Electronically Signed   By: Debbe Odea M.D.   On: 03/27/2023 13:54   DG Chest Portable 1 View  Result Date: 03/27/2023 CLINICAL DATA:  Left-sided chest pain. EXAM: PORTABLE CHEST 1 VIEW COMPARISON:  March 05, 2023 FINDINGS: The heart size and mediastinal contours are within normal limits. Mild atelectasis and/or infiltrate is seen within the bilateral lung bases. No pleural effusion or pneumothorax are identified. The visualized skeletal structures are unremarkable. IMPRESSION: Mild bibasilar atelectasis and/or infiltrate. Electronically Signed   By: Aram Candela M.D.   On: 03/27/2023 03:29      Impression / Plan:   Assessment: Principal Problem:   Unstable angina (HCC) Active Problems:   ETOH abuse   HTN (hypertension)   GI bleeding   Ricardo Goodman is a 57 y.o. y/o male with admitted with atypical chest pain and a diagnosis unstable angina.  The  patient has had rectal bleeding for 2 weeks.  The patient denies having a colonoscopy in the past.  He is being evaluated by cardiology.  His alcohol level was elevated at 323.  Plan:  The patient will be set up for colonoscopy for tomorrow.  The patient is being evaluated by cardiology for cardiac clearance before undergoing any endoscopic procedures.  It appears that the patient had a CT angiography today that showed a calcium score of 0.  The CT also showed severe hepatic steatosis consistent with his long use of alcohol.  The patient has been explained the plan and will be given a prep today for the colonoscopy tomorrow.  Thank you for involving me in the care of this patient.      LOS: 0 days   Ricardo Minium, MD, [facility] 03/27/2023, 2:02 PM,  Pager [phone] 7am-5pm  Check AMION for 5pm -7am coverage and on weekends   Note: This dictation was prepared with Dragon dictation along with smaller phrase technology. Any transcriptional errors that result from this process are unintentional.

## 2023-03-27 NOTE — Assessment & Plan Note (Addendum)
Patient reports passing clots in his stool x 2 weeks.  Noted admission 2014 for upper GI bleeding in the setting of alcohol use.  Hemoglobin stable at 13.4. Case discussed with Dr. Servando Snare with gastroenterology who will formally evaluate the patient. On IV PPI  Trend hemoglobin Follow

## 2023-03-27 NOTE — ED Notes (Signed)
Patient transported to CT 

## 2023-03-27 NOTE — Assessment & Plan Note (Addendum)
BP stable  Titrate home regimen

## 2023-03-27 NOTE — Progress Notes (Signed)
*  PRELIMINARY RESULTS* Echocardiogram 2D Echocardiogram has been performed.  Ricardo Goodman 03/27/2023, 12:37 PM

## 2023-03-27 NOTE — ED Notes (Signed)
Echo at bedside

## 2023-03-28 ENCOUNTER — Encounter: Payer: Self-pay | Admitting: Internal Medicine

## 2023-03-28 ENCOUNTER — Inpatient Hospital Stay: Payer: Medicaid Other | Admitting: Anesthesiology

## 2023-03-28 ENCOUNTER — Encounter
Admission: EM | Disposition: A | Discharge status: Home / Self Care | Payer: Self-pay | Source: Home / Self Care | Attending: Internal Medicine

## 2023-03-28 ENCOUNTER — Inpatient Hospital Stay: Payer: Self-pay | Admitting: Anesthesiology

## 2023-03-28 DIAGNOSIS — K5730 Diverticulosis of large intestine without perforation or abscess without bleeding: Secondary | ICD-10-CM | POA: Diagnosis not present

## 2023-03-28 DIAGNOSIS — K922 Gastrointestinal hemorrhage, unspecified: Secondary | ICD-10-CM | POA: Diagnosis not present

## 2023-03-28 DIAGNOSIS — I10 Essential (primary) hypertension: Secondary | ICD-10-CM | POA: Diagnosis not present

## 2023-03-28 DIAGNOSIS — K635 Polyp of colon: Secondary | ICD-10-CM | POA: Diagnosis not present

## 2023-03-28 DIAGNOSIS — F1010 Alcohol abuse, uncomplicated: Secondary | ICD-10-CM | POA: Diagnosis not present

## 2023-03-28 DIAGNOSIS — K648 Other hemorrhoids: Secondary | ICD-10-CM | POA: Diagnosis not present

## 2023-03-28 DIAGNOSIS — R079 Chest pain, unspecified: Secondary | ICD-10-CM | POA: Diagnosis not present

## 2023-03-28 HISTORY — PX: COLONOSCOPY WITH PROPOFOL: SHX5780

## 2023-03-28 HISTORY — PX: POLYPECTOMY: SHX5525

## 2023-03-28 LAB — HEMOGLOBIN AND HEMATOCRIT, BLOOD
HCT: 34.4 % — ABNORMAL LOW (ref 39.0–52.0)
Hemoglobin: 12.2 g/dL — ABNORMAL LOW (ref 13.0–17.0)

## 2023-03-28 SURGERY — COLONOSCOPY WITH PROPOFOL
Anesthesia: General

## 2023-03-28 MED ORDER — LIDOCAINE HCL (CARDIAC) PF 100 MG/5ML IV SOSY
PREFILLED_SYRINGE | INTRAVENOUS | Status: DC | PRN
  Administered 2023-03-28: 50 mg via INTRAVENOUS

## 2023-03-28 MED ORDER — LIDOCAINE HCL (PF) 2 % IJ SOLN
INTRAMUSCULAR | Status: AC
  Filled 2023-03-28: qty 5

## 2023-03-28 MED ORDER — ALUM & MAG HYDROXIDE-SIMETH 200-200-20 MG/5ML PO SUSP
30.0000 mL | Freq: Once | ORAL | Status: AC
  Administered 2023-03-28: 30 mL via ORAL
  Filled 2023-03-28: qty 30

## 2023-03-28 MED ORDER — PROPOFOL 10 MG/ML IV BOLUS
INTRAVENOUS | Status: DC | PRN
Start: 2023-03-28 — End: 2023-03-28
  Administered 2023-03-28: 140 mg via INTRAVENOUS
  Administered 2023-03-28: 20 mg via INTRAVENOUS

## 2023-03-28 MED ORDER — PROPOFOL 500 MG/50ML IV EMUL
INTRAVENOUS | Status: DC | PRN
  Administered 2023-03-28: 150 ug/kg/min via INTRAVENOUS

## 2023-03-28 MED ORDER — PANTOPRAZOLE SODIUM 40 MG PO TBEC
40.0000 mg | DELAYED_RELEASE_TABLET | Freq: Every day | ORAL | Status: DC
  Administered 2023-03-28 – 2023-03-29 (×2): 40 mg via ORAL
  Filled 2023-03-28 (×2): qty 1

## 2023-03-28 NOTE — Progress Notes (Signed)
The patient had a colonoscopy with only internal hemorrhoids seen.  The patient stated that he had blood throughout the prep although the terminal ileum and entire colon had no sign of any old blood or new blood.  Therefore his inflamed large hemorrhoids are likely the cause of his bleeding.  Consider conservative treatment versus surgical consult.  Nothing further to do from a GI point of view.  I will sign off.  Please call if any further GI concerns or questions.  We would like to thank you for the opportunity to participate in the care of Ricardo Goodman.

## 2023-03-28 NOTE — Op Note (Signed)
[facility] Gastroenterology Patient Name: Ricardo Goodman Procedure Date: 03/28/2023 10:41 AM MRN: [id number] Account #: [id number] Date of Birth: 1965-12-19 Admit Type: Outpatient Age: 57 Room: [facility] ENDO ROOM 4 Gender: Male Note Status: Finalized Instrument Name: Nelda Marseille [id number] Procedure:             Colonoscopy Indications:           Hematochezia Providers:             [name] MD, MD Medicines:             Propofol per Anesthesia Complications:         No immediate complications. Procedure:             Pre-Anesthesia Assessment:                        - Prior to the procedure, a History and Physical was                         performed, and patient medications and allergies were                         reviewed. The patient's tolerance of previous                         anesthesia was also reviewed. The risks and benefits                         of the procedure and the sedation options and risks                         were discussed with the patient. All questions were                         answered, and informed consent was obtained. Prior                         Anticoagulants: The patient has taken no anticoagulant                         or antiplatelet agents. ASA Grade Assessment: II - A                         patient with mild systemic disease. After reviewing                         the risks and benefits, the patient was deemed in                         satisfactory condition to undergo the procedure.                        After obtaining informed consent, the colonoscope was                         passed under direct vision. Throughout the procedure,                         the patient's blood pressure, pulse, and oxygen  saturations were monitored continuously. The                         Colonoscope was introduced through the anus and                         advanced to the the cecum, identified by appendiceal                          orifice and ileocecal valve. The colonoscopy was                         performed without difficulty. The patient tolerated                         the procedure well. The quality of the bowel                         preparation was good. Findings:      The perianal and digital rectal examinations were normal.      A 3 mm polyp was found in the sigmoid colon. The polyp was sessile. The       polyp was removed with a cold snare. Resection and retrieval were       complete.      The terminal ileum appeared normal.      A few small-mouthed diverticula were found in the ascending colon.      Bleeding internal hemorrhoids were found during retroflexion. Impression:            - One 3 mm polyp in the sigmoid colon, removed with a                         cold snare. Resected and retrieved.                        - The examined portion of the ileum was normal.                        - Diverticulosis in the ascending colon.                        - Bleeding internal hemorrhoids.                        - The colon and ileum had no old or fresh blood                         despite reports of bleeding throughout the prpe. Recommendation:        - Return patient to hospital ward for ongoing care.                        - consider surgery consult for hemorrhoidectomy Procedure Code(s):     --- Professional ---                        [phone], Colonoscopy, flexible; with removal of                         tumor(s), polyp(s),  or other lesion(s) by snare                         technique Diagnosis Code(s):     --- Professional ---                        K92.1, Melena (includes Hematochezia)                        D12.5, Benign neoplasm of sigmoid colon CPT copyright 2022 American Medical Association. All rights reserved. The codes documented in this report are preliminary and upon coder review may  be revised to meet current compliance requirements. [name] MD, MD 03/28/2023  11:13:30 AM This report has been signed electronically. Number of Addenda: 0 Note Initiated On: 03/28/2023 10:41 AM Scope Withdrawal Time: 0 hours 8 minutes 58 seconds  Total Procedure Duration: 0 hours 13 minutes 15 seconds  Estimated Blood Loss:  Estimated blood loss: none.      [facility]

## 2023-03-28 NOTE — Hospital Course (Addendum)
Taken from prior notes.  Ricardo Goodman is a 57 y.o. male with medical history significant of alcohol abuse, hypertension, tobacco use presenting with chest pain,GI bleeding.   Patient was complaining of multiple episodes of central chest pain radiating to neck and left arm. EKG with NSR and nonspecific ST-T changes.  Patient was recently seen at cardiology and coronary CTA and echocardiogram was recommended.  Troponin remained negative. Echocardiogram with normal EF and no regional wall motion abnormalities. Coronary CTA was also done by cardiology and was largely unremarkable and a calcium score of 0.  Cardiology recommended GI evaluation as likely not having cardiac pain. Lipid panel with elevated total cholesterol at 303, HDL 91 and LDL of 194 with goal should be less than 70.  A1c of 5.1.  Significantly elevated ethanol level at 323. No further cardiac workup needed at this time.  Patient also has an history of rectal bleeding with some passing clots.  GI was consulted.  Hemoglobin seems stable and at 12.2 today.  Colonoscopy today with removal of 1 small polyp and also found to have internal hemorrhoids.  GI is recommending follow-up with general surgery for definitive treatment of internal hemorrhoids.  8/7: Hemodynamically stable.  Hemoglobin at 12.7 and no more active bleeding. Patient need to see general surgery as outpatient for definitive treatment of internal hemorrhoid. He was given Anusol suppository to use as needed and advised to avoid constipation.  Liver ultrasound was unremarkable.  No sign of cirrhosis. Patient was counseled again for alcohol and smoking cessation.  He will continue on current medications and need to have a close follow-up with his providers for further recommendations.

## 2023-03-28 NOTE — Anesthesia Preprocedure Evaluation (Addendum)
Anesthesia Evaluation  Patient identified by MRN, date of birth, ID band Patient awake    Reviewed: Allergy & Precautions, NPO status , Patient's Chart, lab work & pertinent test results  History of Anesthesia Complications Negative for: history of anesthetic complications  Airway Mallampati: III  TM Distance: >3 FB Neck ROM: full    Dental  (+) Chipped   Pulmonary neg pulmonary ROS, neg shortness of breath   Pulmonary exam normal        Cardiovascular Exercise Tolerance: Good hypertension, (-) angina Normal cardiovascular exam     Neuro/Psych negative neurological ROS  negative psych ROS   GI/Hepatic Neg liver ROS, PUD,GERD  Controlled,,  Endo/Other  negative endocrine ROS    Renal/GU negative Renal ROS  negative genitourinary   Musculoskeletal   Abdominal   Peds  Hematology negative hematology ROS (+)   Anesthesia Other Findings Past Medical History: No date: DVT (deep venous thrombosis) (HCC) 1990: Gastric ulcer No date: Hypertension  Past Surgical History: No date: ANKLE SURGERY 1999: gastric ulcer surgery No date: ORIF PROXIMAL TIBIOFIBULAR JOINT  BMI    Body Mass Index: 30.57 kg/m      Reproductive/Obstetrics negative OB ROS                             Anesthesia Physical Anesthesia Plan  ASA: 2  Anesthesia Plan: General   Post-op Pain Management:    Induction: Intravenous  PONV Risk Score and Plan: Propofol infusion and TIVA  Airway Management Planned: Natural Airway and Nasal Cannula  Additional Equipment:   Intra-op Plan:   Post-operative Plan:   Informed Consent: I have reviewed the patients History and Physical, chart, labs and discussed the procedure including the risks, benefits and alternatives for the proposed anesthesia with the patient or authorized representative who has indicated his/her understanding and acceptance.     Dental Advisory  Given  Plan Discussed with: Anesthesiologist, CRNA and Surgeon  Anesthesia Plan Comments: (Patient consented for risks of anesthesia including but not limited to:  - adverse reactions to medications - risk of airway placement if required - damage to eyes, teeth, lips or other oral mucosa - nerve damage due to positioning  - sore throat or hoarseness - Damage to heart, brain, nerves, lungs, other parts of body or loss of life  Patient voiced understanding.)       Anesthesia Quick Evaluation

## 2023-03-28 NOTE — Progress Notes (Signed)
Progress Note   Patient: Ricardo Goodman BFX:[id number] DOB: 05/16/1966 DOA: 03/27/2023     1 DOS: the patient was seen and examined on 03/28/2023   Brief hospital course: Taken from prior notes.  GENE WEATHERBY is a 57 y.o. male with medical history significant of alcohol abuse, hypertension, tobacco use presenting with chest pain,GI bleeding.   Patient was complaining of multiple episodes of central chest pain radiating to neck and left arm. EKG with NSR and nonspecific ST-T changes.  Patient was recently seen at cardiology and coronary CTA and echocardiogram was recommended.  Troponin remained negative. Echocardiogram with normal EF and no regional wall motion abnormalities. Coronary CTA was also done by cardiology and was largely unremarkable and a calcium score of 0.  Cardiology recommended GI evaluation as likely not having cardiac pain. Lipid panel with elevated total cholesterol at 303, HDL 91 and LDL of 194 with goal should be less than 70.  A1c of 5.1.  Significantly elevated ethanol level at 323. No further cardiac workup needed at this time.  Patient also has an history of rectal bleeding with some passing clots.  GI was consulted.  Hemoglobin seems stable and at 12.2 today.  Going for colonoscopy.    Assessment and Plan: * Chest pain Recurrent atypical chest pain.  Troponin remained negative, nonspecific EKG changes and echocardiogram normal.  Cardiology was consulted and patient had his CT angio done which shows a calcium score of 0.  Likely noncardiac in origin, concern of gastric in origin secondary to significant alcohol abuse. -Trial of GI cocktail -PPI -Will get liver ultrasound to rule out cirrhosis or portal hypertension  ETOH abuse Heavy ETOH use including 6pk of beer, 1 pint vodka daily  ETOH level 323 Continue CIWA protocol  Obtaining liver ultrasound Pt reports being ready to quit   GI bleeding Patient reports passing clots in his stool x 2 weeks.  Noted  admission 2014 for upper GI bleeding in the setting of alcohol use.  Hemoglobin at 12.2 today. Patient underwent colonoscopy with GI today, found to have bleeding internal hemorrhoid, also 1 polyp was removed. GI is recommending general surgery consult, likely will see him as outpatient. -Monitor for more bleeding -Monitor hemoglobin  HTN (hypertension) BP stable  -Continue home amlodipine    Subjective: Patient was seen after the colonoscopy.  Continue to have intermittent central chest pain, no radiation and no other associated symptoms.  Counseling was provided for alcohol cessation  Physical Exam: Vitals:   03/28/23 0758 03/28/23 1116 03/28/23 1126 03/28/23 1249  BP: (!) 158/86 113/67 (!) 115/58 (!) 138/100  Pulse: 67 78 62 71  Resp: 16 14 13 16   Temp: 98.2 F (36.8 C) (!) 97 F (36.1 C)  98 F (36.7 C)  TempSrc: Oral Temporal  Oral  SpO2: 96% 97% 98% 97%  Weight:      Height:       General.  Well-developed gentleman, in no acute distress. Pulmonary.  Lungs clear bilaterally, normal respiratory effort. CV.  Regular rate and rhythm, no JVD, rub or murmur. Abdomen.  Soft, nontender, nondistended, BS positive. CNS.  Alert and oriented .  No focal neurologic deficit. Extremities.  No edema, no cyanosis, pulses intact and symmetrical. Psychiatry.  Judgment and insight appears normal.   Data Reviewed: Prior data reviewed  Family Communication: Discussed with patient  Disposition: Status is: Inpatient Remains inpatient appropriate because: Severity of illness  Planned Discharge Destination: Home  Time spent: 45 minutes This record has  been created using Conservation officer, historic buildings. Errors have been sought and corrected,but may not always be located. Such creation errors do not reflect on the standard of care.   Author: Arnetha Courser, MD 03/28/2023 12:49 PM  For on call review [email or web address].

## 2023-03-28 NOTE — Anesthesia Postprocedure Evaluation (Signed)
Anesthesia Post Note  Patient: Ricardo Goodman  Procedure(s) Performed: COLONOSCOPY WITH PROPOFOL POLYPECTOMY  Patient location during evaluation: Endoscopy Anesthesia Type: General Level of consciousness: awake and alert Pain management: pain level controlled Vital Signs Assessment: post-procedure vital signs reviewed and stable Respiratory status: spontaneous breathing, nonlabored ventilation, respiratory function stable and patient connected to nasal cannula oxygen Cardiovascular status: blood pressure returned to baseline and stable Postop Assessment: no apparent nausea or vomiting Anesthetic complications: no   No notable events documented.   Last Vitals:  Vitals:   03/28/23 1116 03/28/23 1126  BP: 113/67 (!) 115/58  Pulse: 78 62  Resp: 14 13  Temp: (!) 36.1 C   SpO2: 97% 98%    Last Pain:  Vitals:   03/28/23 1126  TempSrc:   PainSc: 0-No pain                 Cleda Mccreedy Rukiya Hodgkins

## 2023-03-28 NOTE — Transfer of Care (Signed)
Immediate Anesthesia Transfer of Care Note  Patient: Ricardo Goodman  Procedure(s) Performed: COLONOSCOPY WITH PROPOFOL POLYPECTOMY  Patient Location: Endoscopy Unit  Anesthesia Type:General  Level of Consciousness: awake, alert , and oriented  Airway & Oxygen Therapy: Patient Spontanous Breathing  Post-op Assessment: Report given to RN and Post -op Vital signs reviewed and stable  Post vital signs: Reviewed and stable  Last Vitals:  Vitals Value Taken Time  BP 113/67 03/28/23 1116  Temp    Pulse 78 03/28/23 1116  Resp 14 03/28/23 1116  SpO2 97 % 03/28/23 1116    Last Pain:  Vitals:   03/28/23 0800  TempSrc:   PainSc: Asleep      Patients Stated Pain Goal: 0 (03/28/23 0734)  Complications: No notable events documented.

## 2023-03-29 ENCOUNTER — Inpatient Hospital Stay: Payer: Medicaid Other

## 2023-03-29 ENCOUNTER — Encounter: Payer: Self-pay | Admitting: Gastroenterology

## 2023-03-29 DIAGNOSIS — K921 Melena: Secondary | ICD-10-CM | POA: Diagnosis not present

## 2023-03-29 DIAGNOSIS — R079 Chest pain, unspecified: Secondary | ICD-10-CM | POA: Diagnosis not present

## 2023-03-29 DIAGNOSIS — F1010 Alcohol abuse, uncomplicated: Secondary | ICD-10-CM | POA: Diagnosis not present

## 2023-03-29 DIAGNOSIS — E876 Hypokalemia: Secondary | ICD-10-CM | POA: Diagnosis not present

## 2023-03-29 DIAGNOSIS — K635 Polyp of colon: Secondary | ICD-10-CM

## 2023-03-29 LAB — CBC
HCT: 36.2 % — ABNORMAL LOW (ref 39.0–52.0)
Hemoglobin: 12.7 g/dL — ABNORMAL LOW (ref 13.0–17.0)
MCH: 34 pg (ref 26.0–34.0)
MCHC: 35.1 g/dL (ref 30.0–36.0)
MCV: 97.1 fL (ref 80.0–100.0)
Platelets: 174 10*3/uL (ref 150–400)
RBC: 3.73 MIL/uL — ABNORMAL LOW (ref 4.22–5.81)
RDW: 12.1 % (ref 11.5–15.5)
WBC: 6.5 10*3/uL (ref 4.0–10.5)
nRBC: 0 % (ref 0.0–0.2)

## 2023-03-29 MED ORDER — ADULT MULTIVITAMIN W/MINERALS CH: 1.0000 | ORAL_TABLET | Freq: Every day | ORAL | 0 refills | Status: AC

## 2023-03-29 MED ORDER — HYDROCORTISONE ACETATE 25 MG RE SUPP: 25.0000 mg | Freq: Two times a day (BID) | RECTAL | 1 refills | Status: DC | PRN

## 2023-03-29 MED ORDER — VITAMIN B-1 100 MG PO TABS: 100.0000 mg | ORAL_TABLET | Freq: Every day | ORAL | 0 refills | Status: AC

## 2023-03-29 MED ORDER — FOLIC ACID 1 MG PO TABS: 1.0000 mg | ORAL_TABLET | Freq: Every day | ORAL | 0 refills | Status: AC

## 2023-03-29 NOTE — Discharge Summary (Signed)
Physician Discharge Summary   Patient: Ricardo Goodman MRN: [id number] DOB: [birth year]  Admit date:     03/27/2023  Discharge date: 03/29/23  Discharge Physician: Arnetha Courser   PCP: [facility]   Recommendations at discharge:  Please obtain CBC and BMP in 1 week Follow-up with primary care provider Follow-up with general surgery Follow-up with gastroenterology for polyp pathology results  Discharge Diagnoses: Principal Problem:   Chest pain Active Problems:   Alcohol abuse   GI bleeding   HTN (hypertension)   Lower GI bleed   Polyp of sigmoid colon   Hematochezia   Hypokalemia   Hospital Course: Taken from prior notes.  Ricardo Goodman is a 57 y.o. male with medical history significant of alcohol abuse, hypertension, tobacco use presenting with chest pain,GI bleeding.   Patient was complaining of multiple episodes of central chest pain radiating to neck and left arm. EKG with NSR and nonspecific ST-T changes.  Patient was recently seen at cardiology and coronary CTA and echocardiogram was recommended.  Troponin remained negative. Echocardiogram with normal EF and no regional wall motion abnormalities. Coronary CTA was also done by cardiology and was largely unremarkable and a calcium score of 0.  Cardiology recommended GI evaluation as likely not having cardiac pain. Lipid panel with elevated total cholesterol at 303, HDL 91 and LDL of 194 with goal should be less than 70.  A1c of 5.1.  Significantly elevated ethanol level at 323. No further cardiac workup needed at this time.  Patient also has an history of rectal bleeding with some passing clots.  GI was consulted.  Hemoglobin seems stable and at 12.2 today.  Colonoscopy today with removal of 1 small polyp and also found to have internal hemorrhoids.  GI is recommending follow-up with general surgery for definitive treatment of internal hemorrhoids.  8/7: Hemodynamically stable.  Hemoglobin at 12.7 and  no more active bleeding. Patient need to see general surgery as outpatient for definitive treatment of internal hemorrhoid. He was given Anusol suppository to use as needed and advised to avoid constipation.  Liver ultrasound was unremarkable.  No sign of cirrhosis. Patient was counseled again for alcohol and smoking cessation.  He will continue on current medications and need to have a close follow-up with his providers for further recommendations.  Assessment and Plan: * Chest pain Recurrent atypical chest pain.  Troponin remained negative, nonspecific EKG changes and echocardiogram normal.  Cardiology was consulted and patient had his CT angio done which shows a calcium score of 0.  Likely noncardiac in origin, concern of gastric in origin secondary to significant alcohol abuse. -Trial of GI cocktail -PPI -Ultrasound liver was normal  Alcohol abuse Heavy ETOH use including 6pk of beer, 1 pint vodka daily  ETOH level 323 Continue CIWA protocol  Obtaining liver ultrasound-unremarkable Pt reports being ready to quit   GI bleeding Patient reports passing clots in his stool x 2 weeks.  Noted admission 2014 for upper GI bleeding in the setting of alcohol use.  Hemoglobin at 12.2 today. Patient underwent colonoscopy with GI today, found to have bleeding internal hemorrhoid, also 1 polyp was removed. GI is recommending general surgery consult, likely will see him as outpatient. -Monitor for more bleeding -Monitor hemoglobin  HTN (hypertension) BP stable  -Continue home amlodipine  Consultants: Cardiology.  Gastroenterology Procedures performed: Colonoscopy Disposition: Home Diet recommendation:  Discharge Diet Orders (From admission, onward)     Start     Ordered   03/29/23 0000  Diet - low sodium heart healthy        03/29/23 1055           Cardiac diet DISCHARGE MEDICATION: Allergies as of 03/29/2023   No Known Allergies      Medication List     STOP taking these  medications    ibuprofen 100 MG/5ML suspension Commonly known as: ADVIL   ketorolac 10 MG tablet Commonly known as: TORADOL   predniSONE 20 MG tablet Commonly known as: DELTASONE       TAKE these medications    albuterol 108 (90 Base) MCG/ACT inhaler Commonly known as: VENTOLIN HFA Inhale 2 puffs into the lungs every 4 (four) hours as needed for wheezing or shortness of breath.   amLODipine 5 MG tablet Commonly known as: NORVASC Take 1 tablet (5 mg total) by mouth daily.   cyclobenzaprine 5 MG tablet Commonly known as: Flexeril Take 1 tablet (5 mg total) by mouth 3 (three) times daily as needed for muscle spasms.   folic acid 1 MG tablet Commonly known as: FOLVITE Take 1 tablet (1 mg total) by mouth daily. Start taking on: March 30, 2023   gabapentin 300 MG capsule Commonly known as: NEURONTIN Take 300 mg by mouth 3 (three) times daily.   hydrocortisone 25 MG suppository Commonly known as: ANUSOL-HC Place 1 suppository (25 mg total) rectally 2 (two) times daily as needed for hemorrhoids or anal itching.   multivitamin with minerals Tabs tablet Take 1 tablet by mouth daily. Start taking on: March 30, 2023   omeprazole 20 MG capsule Commonly known as: PRILOSEC Take 1 capsule (20 mg total) by mouth daily.   thiamine 100 MG tablet Commonly known as: Vitamin B-1 Take 1 tablet (100 mg total) by mouth daily. Start taking on: March 30, 2023        Follow-up Information     [facility]. Go on 04/27/2023.   Specialty: General Practice Why: @2 :20pm Contact information: [address] [place] [address] [phone]         Henrene Dodge, MD. Go in 1 week(s).   Specialty: General Surgery Why: Appointment on Monday, 04/10/2023 at 10:30am. Contact information: [address] [place] [address] [phone]                Discharge Exam: Ricardo Goodman Weights   03/27/23 0300  Weight: 91.2 kg   General.   Well-developed gentleman, in no acute distress. Pulmonary.  Lungs clear bilaterally, normal respiratory effort. CV.  Regular rate and rhythm, no JVD, rub or murmur. Abdomen.  Soft, nontender, nondistended, BS positive. CNS.  Alert and oriented .  No focal neurologic deficit. Extremities.  No edema, no cyanosis, pulses intact and symmetrical. Psychiatry.  Judgment and insight appears normal.   Condition at discharge: stable  The results of significant diagnostics from this hospitalization (including imaging, microbiology, ancillary and laboratory) are listed below for reference.   Imaging Studies: US Abdomen Limited RUQ (LIVER/GB)  Result Date: 03/29/2023 CLINICAL DATA:  [id number] Pain [id number]. EXAM: ULTRASOUND ABDOMEN LIMITED RIGHT UPPER QUADRANT COMPARISON:  CT abdomen/pelvis 10/07/2012. FINDINGS: Gallbladder: No gallstones or wall thickening visualized. No sonographic Murphy sign noted by sonographer. Common bile duct: Diameter: 4.9 mm. Liver: No focal lesion identified. Within normal limits in parenchymal echogenicity. Portal vein is patent on color Doppler imaging with normal direction of blood flow towards the liver. Other: None. IMPRESSION: Unremarkable right upper quadrant ultrasound. Electronically Signed   By: Elwyn Reach.D.  On: 03/29/2023 10:15   ECHOCARDIOGRAM COMPLETE  Result Date: 03/27/2023    ECHOCARDIOGRAM REPORT   Patient Name:   ELREE BOENING Date of Exam: 03/27/2023 Medical Rec #:  [id number]        Height:       68.0 in Accession #:    [id number]       Weight:       201.1 lb Date of Birth:  1966-08-10        BSA:          2.048 m Patient Age:    57 years         BP:           125/85 mmHg Patient Gender: M                HR:           53 bpm. Exam Location:  [facility] Procedure: 2D Echo, Cardiac Doppler, Color Doppler and Strain Analysis Indications:     Chest Pain  History:         Patient has no prior history of Echocardiogram examinations.                  Signs/Symptoms:Chest  Pain; Risk Factors:Hypertension.  Sonographer:     Mikki Harbor Referring Phys:  [phone] Francoise Schaumann [place] Diagnosing Phys: Yvonne Kendall MD  Sonographer Comments: Global longitudinal strain was attempted. IMPRESSIONS  1. Left ventricular ejection fraction, by estimation, is 60 to 65%. The left ventricle has normal function. The left ventricle has no regional wall motion abnormalities. Left ventricular diastolic parameters were normal. The average left ventricular global longitudinal strain is -18.0 %. The global longitudinal strain is normal.  2. Right ventricular systolic function is normal. The right ventricular size is normal. There is mildly elevated pulmonary artery systolic pressure.  3. The mitral valve is normal in structure. Mild to moderate mitral valve regurgitation. No evidence of mitral stenosis.  4. The aortic valve is tricuspid. Aortic valve regurgitation is not visualized. No aortic stenosis is present.  5. The inferior vena cava is dilated in size with <50% respiratory variability, suggesting right atrial pressure of 15 mmHg. FINDINGS  Left Ventricle: Left ventricular ejection fraction, by estimation, is 60 to 65%. The left ventricle has normal function. The left ventricle has no regional wall motion abnormalities. The average left ventricular global longitudinal strain is -18.0 %. The global longitudinal strain is normal. The left ventricular internal cavity size was normal in size. There is no left ventricular hypertrophy. Left ventricular diastolic parameters were normal. Right Ventricle: The right ventricular size is normal. No increase in right ventricular wall thickness. Right ventricular systolic function is normal. There is mildly elevated pulmonary artery systolic pressure. The tricuspid regurgitant velocity is 2.54  m/s, and with an assumed right atrial pressure of 15 mmHg, the estimated right ventricular systolic pressure is 40.8 mmHg. Left Atrium: Left atrial size was normal in size.  Right Atrium: Right atrial size was normal in size. Pericardium: There is no evidence of pericardial effusion. Mitral Valve: The mitral valve is normal in structure. Mild to moderate mitral valve regurgitation. No evidence of mitral valve stenosis. MV peak gradient, 4.4 mmHg. The mean mitral valve gradient is 1.0 mmHg. Tricuspid Valve: The tricuspid valve is normal in structure. Tricuspid valve regurgitation is mild. Aortic Valve: The aortic valve is tricuspid. Aortic valve regurgitation is not visualized. No aortic stenosis is present. Aortic valve mean gradient measures 4.0 mmHg. Aortic valve peak  gradient measures 8.1 mmHg. Aortic valve area, by VTI measures 3.02 cm. Pulmonic Valve: The pulmonic valve was normal in structure. Pulmonic valve regurgitation is mild. No evidence of pulmonic stenosis. Aorta: The aortic root is normal in size and structure. Pulmonary Artery: The pulmonary artery is of normal size. Venous: The inferior vena cava is dilated in size with less than 50% respiratory variability, suggesting right atrial pressure of 15 mmHg. IAS/Shunts: The interatrial septum was not well visualized.  LEFT VENTRICLE PLAX 2D LVIDd:         4.70 cm     Diastology LVIDs:         3.20 cm     LV e' medial:    8.92 cm/s LV PW:         0.91 cm     LV E/e' medial:  11.3 LV IVS:        0.73 cm     LV e' lateral:   10.80 cm/s LVOT diam:     2.00 cm     LV E/e' lateral: 9.4 LV SV:         101 LV SV Index:   49          2D Longitudinal Strain LVOT Area:     3.14 cm    2D Strain GLS Avg:     -18.0 %  LV Volumes (MOD) LV vol d, MOD A2C: 80.8 ml LV vol d, MOD A4C: 60.3 ml LV vol s, MOD A2C: 27.7 ml LV vol s, MOD A4C: 22.3 ml LV SV MOD A2C:     53.1 ml LV SV MOD A4C:     60.3 ml LV SV MOD BP:      44.2 ml RIGHT VENTRICLE RV Basal diam:  4.10 cm RV Mid diam:    3.50 cm RV S prime:     13.80 cm/s TAPSE (M-mode): 2.1 cm LEFT ATRIUM             Index        RIGHT ATRIUM           Index LA diam:        3.60 cm 1.76 cm/m   RA  Area:     16.20 cm LA Vol (A2C):   56.0 ml 27.34 ml/m  RA Volume:   44.40 ml  21.68 ml/m LA Vol (A4C):   50.5 ml 24.65 ml/m LA Biplane Vol: 53.1 ml 25.92 ml/m  AORTIC VALVE                    PULMONIC VALVE AV Area (Vmax):    3.08 cm     PV Vmax:       0.98 m/s AV Area (Vmean):   2.85 cm     PV Peak grad:  3.8 mmHg AV Area (VTI):     3.02 cm AV Vmax:           142.00 cm/s AV Vmean:          92.500 cm/s AV VTI:            0.334 m AV Peak Grad:      8.1 mmHg AV Mean Grad:      4.0 mmHg LVOT Vmax:         139.00 cm/s LVOT Vmean:        83.900 cm/s LVOT VTI:          0.321 m LVOT/AV VTI ratio: 0.96  AORTA Ao Root diam: 2.90 cm  MITRAL VALVE                TRICUSPID VALVE MV Area (PHT): 3.51 cm     TR Peak grad:   25.8 mmHg MV Area VTI:   3.48 cm     TR Vmax:        254.00 cm/s MV Peak grad:  4.4 mmHg MV Mean grad:  1.0 mmHg     SHUNTS MV Vmax:       1.05 m/s     Systemic VTI:  0.32 m MV Vmean:      44.3 cm/s    Systemic Diam: 2.00 cm MV Decel Time: 216 msec MV E velocity: 101.00 cm/s MV A velocity: 74.90 cm/s MV E/A ratio:  1.35 Yvonne Kendall MD Electronically signed by Yvonne Kendall MD Signature Date/Time: 03/27/2023/3:58:37 PM    Final    CT CORONARY MORPH W/CTA COR W/SCORE Vallarie Mare W/CM &/OR WO/CM  Addendum Date: 03/27/2023   ADDENDUM REPORT: 03/27/2023 14:33 CLINICAL DATA:  This over-read does not include interpretation of cardiac or coronary anatomy or pathology. The coronary CTA interpretation by the cardiologist is attached. COMPARISON:  None available. FINDINGS: No suspicious nodules, masses, or infiltrates are identified in the visualized portion of the lungs. No pleural fluid seen. The visualized portions of the mediastinum and hilar regions are unremarkable. Diffuse hepatic steatosis. IMPRESSION: Diffuse hepatic steatosis. Electronically Signed   By: Danae Orleans M.D.   On: 03/27/2023 14:33   Result Date: 03/27/2023 CLINICAL DATA:  Chest pain EXAM: Cardiac/Coronary  CTA TECHNIQUE: The patient was  scanned on a Siemens Somatom force scanner. : A retrospective scan was triggered in the ascending thoracic aorta. Axial non-contrast 3 mm slices were carried out through the heart. The data set was analyzed on a dedicated work station and scored using the Agatson method. Gantry rotation speed was 66 msecs and collimation was .6 mm. 0.8 mg of sl NTG was given. The 3D data set was reconstructed in 5% intervals of the 60-95 % of the R-R cycle. Diastolic phases were analyzed on a dedicated work station using MPR, MIP and VRT modes. The patient received 85 cc of contrast. FINDINGS: Aorta:  Normal size.  No calcifications.  No dissection. Aortic Valve:  Trileaflet.  No calcifications. Coronary Arteries:  Normal coronary origin. left dominance. RCA is a dominant artery. There is no plaque. Left main gives rise to LAD and LCX arteries. LM has no disease. LAD has no plaque. LAD wraps around the apex and supplies posterior AV groove. LCX is a dominant artery.  There is no plaque. Other findings: Normal pulmonary vein drainage into the left atrium. Normal left atrial appendage without a thrombus. Normal size of the pulmonary artery. IMPRESSION: 1. Coronary calcium score of 0. 2. Normal coronary origin with left dominance. 3. No evidence of CAD. 4. CAD-RADS 0. No evidence of CAD (0%). Consider non-atherosclerotic causes of chest pain. Electronically Signed: By: Debbe Odea M.D. On: 03/27/2023 13:54   DG Chest Portable 1 View  Result Date: 03/27/2023 CLINICAL DATA:  Left-sided chest pain. EXAM: PORTABLE CHEST 1 VIEW COMPARISON:  March 05, 2023 FINDINGS: The heart size and mediastinal contours are within normal limits. Mild atelectasis and/or infiltrate is seen within the bilateral lung bases. No pleural effusion or pneumothorax are identified. The visualized skeletal structures are unremarkable. IMPRESSION: Mild bibasilar atelectasis and/or infiltrate. Electronically Signed   By: Aram Candela M.D.   On: 03/27/2023  03:29   CT Angio Chest PE W/Cm &/Or  Wo Cm  Result Date: 03/05/2023 CLINICAL DATA:  Pulmonary embolism suspected with low to intermediate probability, positive D-dimer with worsening chest pain throughout the day, left side with radiation to the left extremities. EXAM: CT ANGIOGRAPHY CHEST WITH CONTRAST TECHNIQUE: Multidetector CT imaging of the chest was performed using the standard protocol during bolus administration of intravenous contrast. Multiplanar CT image reconstructions and MIPs were obtained to evaluate the vascular anatomy. RADIATION DOSE REDUCTION: This exam was performed according to the departmental dose-optimization program which includes automated exposure control, adjustment of the mA and/or kV according to patient size and/or use of iterative reconstruction technique. CONTRAST:  OMNIPAQUE IOHEXOL 350 MG/ML SOLN COMPARISON:  Portable chest today, PA and lateral chest 01/28/2023, CTA chest 06/03/2013. FINDINGS: Cardiovascular: The pulmonary arteries are normal in caliber without evidence of emboli. The cardiac size is normal. There are no visible coronary artery calcifications. There is trace calcific plaque of the distal aortic arch. There is no aortic aneurysm or dissection, with mild tortuosity. The great vessels are not well opacified but branch normally. The pulmonary veins are normal caliber. Mediastinum/Nodes: No enlarged mediastinal, hilar, or axillary lymph nodes. The lower poles of the thyroid gland, the trachea, and esophagus demonstrate no significant findings. The main bronchi are patent. Lungs/Pleura: There is bronchial thickening in the upper and more so of the lower lobes. There is posterior atelectasis in the lower lobes. The lungs are clear of infiltrates and nodules. There is no pleural effusion, thickening or pneumothorax. Upper Abdomen: There are right renal simple cysts. There is severe hepatic steatosis. No acute upper abdominal findings. Musculoskeletal: No acute or  significant osseous findings or chest wall abnormality. Mild thoracic dextroscoliosis. Review of the MIP images confirms the above findings. IMPRESSION: 1. No evidence of arterial dilatation or embolus. 2. Trace aortic atherosclerosis. 3. Bronchitis in the upper and more so of the lower lobes. No evidence of pneumonia. 4. Severe hepatic steatosis. 5. Right renal simple cysts. Aortic Atherosclerosis (ICD10-I70.0). Electronically Signed   By: Almira Bar M.D.   On: 03/05/2023 02:56   DG Chest Port 1 View  Result Date: 03/05/2023 CLINICAL DATA:  Chest pain EXAM: PORTABLE CHEST 1 VIEW COMPARISON:  01/28/2023 FINDINGS: The heart size and mediastinal contours are within normal limits. Both lungs are clear. The visualized skeletal structures are unremarkable. IMPRESSION: No active disease. Electronically Signed   By: Alcide Clever M.D.   On: 03/05/2023 01:39    Microbiology: No results found for this or any previous visit.  Labs: CBC: Recent Labs  Lab 03/27/23 0318 03/27/23 1051 03/27/23 2235 03/28/23 0420 03/29/23 0948  WBC 8.3  --   --   --  6.5  NEUTROABS 3.6  --   --   --   --   HGB 13.4 11.3* 12.1* 12.2* 12.7*  HCT 39.6 34.1* 34.6* 34.4* 36.2*  MCV 100.5*  --   --   --  97.1  PLT 246  --   --   --  174   Basic Metabolic Panel: Recent Labs  Lab 03/27/23 0318  NA 144  K 3.0*  CL 107  CO2 20*  GLUCOSE 101*  BUN 8  CREATININE 0.95  CALCIUM 9.6  MG 2.0   Liver Function Tests: Recent Labs  Lab 03/27/23 0318  AST 100*  ALT 60*  ALKPHOS 86  BILITOT 0.8  PROT 8.0  ALBUMIN 4.9   CBG: No results for input(s): "GLUCAP" in the last 168 hours.  Discharge time spent: greater than  30 minutes.  This record has been created using Conservation officer, historic buildings. Errors have been sought and corrected,but may not always be located. Such creation errors do not reflect on the standard of care.   Signed: Arnetha Courser, MD [facility] 03/29/2023

## 2023-03-29 NOTE — Plan of Care (Signed)
  Problem: Health Behavior/Discharge Planning: Goal: Ability to manage health-related needs will improve Outcome: Progressing   Problem: Clinical Measurements: Goal: Diagnostic test results will improve Outcome: Progressing Goal: Respiratory complications will improve Outcome: Adequate for Discharge Goal: Cardiovascular complication will be avoided Outcome: Adequate for Discharge   Problem: Activity: Goal: Risk for activity intolerance will decrease Outcome: Adequate for Discharge   Problem: Nutrition: Goal: Adequate nutrition will be maintained Outcome: Adequate for Discharge

## 2023-03-30 ENCOUNTER — Encounter: Payer: Self-pay | Admitting: Gastroenterology

## 2023-03-30 ENCOUNTER — Ambulatory Visit: Admission: RE | Admit: 2023-03-30 | Source: Ambulatory Visit

## 2023-04-03 ENCOUNTER — Other Ambulatory Visit: Payer: Self-pay | Admitting: Cardiology

## 2023-04-03 DIAGNOSIS — K219 Gastro-esophageal reflux disease without esophagitis: Secondary | ICD-10-CM

## 2023-04-03 DIAGNOSIS — R079 Chest pain, unspecified: Secondary | ICD-10-CM

## 2023-04-03 DIAGNOSIS — I10 Essential (primary) hypertension: Secondary | ICD-10-CM

## 2023-04-03 DIAGNOSIS — R072 Precordial pain: Secondary | ICD-10-CM

## 2023-04-10 ENCOUNTER — Encounter: Payer: Self-pay | Admitting: Surgery

## 2023-04-10 ENCOUNTER — Ambulatory Visit (INDEPENDENT_AMBULATORY_CARE_PROVIDER_SITE_OTHER): Payer: Medicaid Other | Admitting: Surgery

## 2023-04-10 VITALS — BP 151/93 | HR 67 | Temp 98.2°F | Ht 68.0 in | Wt 196.4 lb

## 2023-04-10 DIAGNOSIS — K648 Other hemorrhoids: Secondary | ICD-10-CM | POA: Diagnosis not present

## 2023-04-10 NOTE — Patient Instructions (Addendum)
Advised to pursue a goal of 25 to 30 g of fiber daily.  Made aware that the majority of this may be through natural sources, but advised to be aware of actual consumption and to ensure minimal consumption by daily supplementation.  Various forms of supplements discussed.  Recommended Psyllium husk, that mixes well with applesauce, or the powder which goes down well shaken with chocolate milk.  Strongly advised to consume more fluids to ensure adequate hydration, instructed to watch color of urine to determine adequacy of hydration.  Clarity is pursued in urine output, and bowel activity that correlates to significant meal intake.   We need to avoid deferring having bowel movements, advised to take the time at the first sign of sensation, typically following meals, and in the morning.   Subsequent utilization of MiraLAX may be needed ensure at least daily movement, ideally twice daily bowel movements.  If multiple doses of MiraLAX are necessary utilize them. Never skip a day...  To be regular, we must do the above EVERY day.   If you have any concerns or questions, please feel free to call our office. See follow up appointment.   Hemorrhoids Hemorrhoids are swollen veins that may form: In the butt (rectum). These are called internal hemorrhoids. Around the opening of the butt (anus). These are called external hemorrhoids. Most hemorrhoids do not cause very bad problems. They often get better with changes to your lifestyle and what you eat. What are the causes? Having trouble pooping (constipation) or watery poop (diarrhea). Pushing too hard when you poop. Pregnancy. Being very overweight (obese). Sitting for too long. Riding a bike for a long time. Heavy lifting or other things that take a lot of effort. Anal sex. What are the signs or symptoms? Pain. Itching or soreness in the butt. Bleeding from the butt. Leaking poop. Swelling. One or more lumps around the opening of your butt. How is  this treated? In most cases, hemorrhoids can be treated at home. You may be told to: Change what you eat. Make changes to your lifestyle. If these treatments do not help, you may need to have a procedure done. Your doctor may need to: Place rubber bands at the bottom of the hemorrhoids to make them fall off. Put medicine into the hemorrhoids to shrink them. Shine a type of light on the hemorrhoids to cause them to fall off. Do surgery to get rid of the hemorrhoids. Follow these instructions at home: Medicines Take over-the-counter and prescription medicines only as told by your doctor. Use creams with medicine in them or medicines that you put in your butt as told by your doctor. Eating and drinking  Eat foods that have a lot of fiber in them. These include whole grains, beans, nuts, fruits, and vegetables. Ask your doctor about taking products that have fiber added to them (fibersupplements). Take in less fat. You can do this by: Eating low-fat dairy products. Eating less red meat. Staying away from processed foods. Drink enough fluid to keep your pee (urine) pale yellow. Managing pain and swelling  Take a warm-water bath (sitz bath) for 20 minutes to ease pain. Do this 3-4 times a day. You may do this in a bathtub. You may also use a portable sitz bath that fits over the toilet. If told, put ice on the painful area. It may help to use ice between your warm baths. Put ice in a plastic bag. Place a towel between your skin and the bag. Leave the  ice on for 20 minutes, 2-3 times a day. If your skin turns bright red, take off the ice right away to prevent skin damage. The risk of damage is higher if you cannot feel pain, heat, or cold. General instructions Exercise. Ask your doctor how much and what kind of exercise is best for you. Go to the bathroom when you need to poop. Do not wait. Try not to push too hard when you poop. Keep your butt dry and clean. Use wet toilet paper or moist  towelettes after you poop. Do not sit on the toilet for a long time. Contact a doctor if: You have pain and swelling that do not get better with treatment. You have trouble pooping. You cannot poop. You have pain or swelling outside the area of the hemorrhoids. Get help right away if: You have bleeding from the butt that will not stop. This information is not intended to replace advice given to you by your health care provider. Make sure you discuss any questions you have with your health care provider. Document Revised: 04/20/2022 Document Reviewed: 04/20/2022 Elsevier Patient Education  2024 [employer].

## 2023-04-10 NOTE — Progress Notes (Signed)
04/10/2023  Reason for Visit: Bleeding internal hemorrhoids  History of Present Illness: Ricardo Goodman is a 57 y.o. male presenting for evaluation of bleeding internal hemorrhoids.  The patient has noticed over the last 3 to 4 months that he has had intermittent episodes of some blood just on the toilet bowl on the tissue paper after wiping.  However most recently, these episodes have become more frequent almost with every bowel movement.  He was admitted to the hospital on 03/27/2023 with multiple symptoms, 1 of which was rectal bleeding.  He had a colonoscopy on 03/28/2023 which showed a small polyp in the sigmoid colon as well as internal hemorrhoids but no other potential source of bleeding.  The patient's bleeding subsided and he was eventually discharged home.  The patient reports that he has a history of constipation with daily bowel movements but of which are very hard and he has to strain hard in order to have a bowel movement.  He does not sit on the toilet bowl for too long of a time but he does have to strain.  Denies any perianal pain at this point.  He did use the Anusol prescribed on discharge, but he does not think this has helped much.  He is still noticing some blood on the toilet tissue paper but not as pronounced as it had been on his admission.  Past Medical History: Past Medical History:  Diagnosis Date   DVT (deep venous thrombosis) (HCC)    Gastric ulcer 1990   Hypertension      Past Surgical History: Past Surgical History:  Procedure Laterality Date   ANKLE SURGERY     COLONOSCOPY WITH PROPOFOL N/A 03/28/2023   Procedure: COLONOSCOPY WITH PROPOFOL;  Surgeon: [name], MD;  Location: [facility] ENDOSCOPY;  Service: Endoscopy;  Laterality: N/A;   gastric ulcer surgery  1999   ORIF PROXIMAL TIBIOFIBULAR JOINT     POLYPECTOMY  03/28/2023   Procedure: POLYPECTOMY;  Surgeon: [name], MD;  Location: [facility] ENDOSCOPY;  Service: Endoscopy;;    Home Medications: Prior to  Admission medications   Medication Sig Start Date End Date Taking? Authorizing Provider  albuterol (VENTOLIN HFA) 108 (90 Base) MCG/ACT inhaler Inhale 2 puffs into the lungs every 4 (four) hours as needed for wheezing or shortness of breath. 03/05/23  Yes Irean Hong, MD  amLODipine (NORVASC) 5 MG tablet Take 1 tablet (5 mg total) by mouth daily. 01/29/23 01/29/24 Yes Dionne Bucy, MD  cyclobenzaprine (FLEXERIL) 5 MG tablet Take 1 tablet (5 mg total) by mouth 3 (three) times daily as needed for muscle spasms. 01/06/15  Yes Menshew, Charlesetta Ivory, PA-C  folic acid (FOLVITE) 1 MG tablet Take 1 tablet (1 mg total) by mouth daily. 03/30/23  Yes Arnetha Courser, MD  gabapentin (NEURONTIN) 300 MG capsule Take 300 mg by mouth 3 (three) times daily.   Yes [provider]  hydrocortisone (ANUSOL-HC) 25 MG suppository Place 1 suppository (25 mg total) rectally 2 (two) times daily as needed for hemorrhoids or anal itching. 03/29/23 03/28/24 Yes Arnetha Courser, MD  Multiple Vitamin (MULTIVITAMIN WITH MINERALS) TABS tablet Take 1 tablet by mouth daily. 03/30/23  Yes Arnetha Courser, MD  omeprazole (PRILOSEC) 20 MG capsule Take 1 capsule (20 mg total) by mouth daily. 03/10/23  Yes Debbe Odea, MD  thiamine (VITAMIN B-1) 100 MG tablet Take 1 tablet (100 mg total) by mouth daily. 03/30/23  Yes Arnetha Courser, MD    Allergies: No Known Allergies  Social History:  reports that he has never smoked. He has never used smokeless tobacco. He reports current alcohol use. He reports that he does not use drugs.   Family History: History reviewed. No pertinent family history.  Review of Systems: Review of Systems  Constitutional:  Negative for chills and fever.  Respiratory:  Negative for shortness of breath.   Cardiovascular:  Negative for chest pain.  Gastrointestinal:  Positive for blood in stool and constipation. Negative for abdominal pain, nausea and vomiting.    Physical Exam BP (!) 151/93   Pulse 67    Temp 98.2 F (36.8 C) (Oral)   Ht 5\' 8"  (1.727 m)   Wt 196 lb 6.4 oz (89.1 kg)   SpO2 97%   BMI 29.86 kg/m  CONSTITUTIONAL: No acute distress, well-nourished HEENT:  Normocephalic, atraumatic, extraocular motion intact. RESPIRATORY:  Normal respiratory effort without pathologic use of accessory muscles. CARDIOVASCULAR: Regular rhythm and rate. RECTAL: External exam reveals normal size hemorrhoid tissue without any enlargement.  On digital rectal exam, there is a mildly enlarged left lateral and right anterior internal hemorrhoid components without any gross blood noted today. MUSCULOSKELETAL:  Normal muscle strength and tone in all four extremities.  No peripheral edema or cyanosis. NEUROLOGIC:  Motor and sensation is grossly normal.  Cranial nerves are grossly intact. PSYCH:  Alert and oriented to person, place and time. Affect is normal.  Laboratory Analysis: Labs from 03/27/2023: Sodium 144, potassium 3.0, chloride 107, CO2 20, BUN 8, creatinine 0.95.  Total bilirubin 0.8, AST 100, ALT 60, alkaline phosphatase 86, albumin 4.9.  WBC 8.3, hemoglobin 13.4, hematocrit 39.6, platelets 246.  Imaging: Colonoscopy on 03/28/2023: Impression:  - One 3 mm polyp in the sigmoid colon, removed with a cold snare. Resected and retrieved.  - The examined portion of the ileum was normal. - Diverticulosis in the ascending colon.  - Bleeding internal hemorrhoids.  - The colon and ileum had no old or fresh blood despite reports of bleeding throughout the prpe.  Assessment and Plan: This is a 57 y.o. male with bleeding internal hemorrhoids.  - Discussed with the patient the findings on his exam today showing somewhat enlarged internal hemorrhoids but no issues with the external components.  Discussed with him that constipation is the most common reason for issues with hemorrhoids.  The first thing to work on would be his constipation to avoid any further straining or irritation of the internal hemorrhoid  tissues.  Discussed with him that he can start adding fiber supplementation to his diet as well as increase liquids.  He can also help by taking MiraLAX once daily to help the stool become softer so that it is easier to pass down.  Discussed with him that sitz bath may not be of much benefit given that the main issue is internally.  Discussed also keeping in mind how much time he is pending on the toilet seat when having a bowel movement so that he is not sitting unnecessarily prolonged time. - Discussed with him that sometimes people do need surgery for this but given that the main issue is internal components, if he ever needs any procedures, we could refer him to gastroenterology for banding which can be done in the office rather than having to do surgery in the operating room. - Patient will follow-up with me in 1 month to reassess his progress.  I spent 30 minutes dedicated to the care of this patient on the date of this encounter to include pre-visit review of records,  face-to-face time with the patient discussing diagnosis and management, and any post-visit coordination of care.   Howie Ill, MD [facility]

## 2023-04-11 ENCOUNTER — Ambulatory Visit

## 2023-05-08 ENCOUNTER — Ambulatory Visit (INDEPENDENT_AMBULATORY_CARE_PROVIDER_SITE_OTHER): Payer: 59 | Admitting: Surgery

## 2023-05-08 ENCOUNTER — Encounter: Payer: Self-pay | Admitting: Surgery

## 2023-05-08 VITALS — BP 150/92 | HR 73 | Temp 98.0°F | Ht 68.0 in | Wt 191.0 lb

## 2023-05-08 DIAGNOSIS — K648 Other hemorrhoids: Secondary | ICD-10-CM

## 2023-05-08 NOTE — Patient Instructions (Signed)
We will refer you to [facility] for them to do internal banding of your hemorrhoids.  They will call you to schedule this appointment.   Follow-up with our office as needed.  Please call and ask to speak with a nurse if you develop questions or concerns.

## 2023-05-08 NOTE — Progress Notes (Signed)
05/08/2023  History of Present Illness: Ricardo Goodman is a 57 y.o. male resenting for follow-up of internal hemorrhoids.  The patient reports that despite of the Anusol suppositories and adding fiber to his diet, he continues having episodes of bleeding with bowel movements.  Reports that he has a bowel movement a couple of times a day but the stool is still a bit hard.  Denies any significant pain but reports some soreness in the perianal area occasionally.  Past Medical History: Past Medical History:  Diagnosis Date   DVT (deep venous thrombosis) (HCC)    Gastric ulcer 1990   Hypertension      Past Surgical History: Past Surgical History:  Procedure Laterality Date   ANKLE SURGERY     COLONOSCOPY WITH PROPOFOL N/A 03/28/2023   Procedure: COLONOSCOPY WITH PROPOFOL;  Surgeon: [name], MD;  Location: [facility] ENDOSCOPY;  Service: Endoscopy;  Laterality: N/A;   gastric ulcer surgery  1999   ORIF PROXIMAL TIBIOFIBULAR JOINT     POLYPECTOMY  03/28/2023   Procedure: POLYPECTOMY;  Surgeon: [name], MD;  Location: [facility] ENDOSCOPY;  Service: Endoscopy;;    Home Medications: Prior to Admission medications   Medication Sig Start Date End Date Taking? Authorizing Provider  albuterol (VENTOLIN HFA) 108 (90 Base) MCG/ACT inhaler Inhale 2 puffs into the lungs every 4 (four) hours as needed for wheezing or shortness of breath. 03/05/23  Yes Irean Hong, MD  amLODipine (NORVASC) 5 MG tablet Take 1 tablet (5 mg total) by mouth daily. 01/29/23 01/29/24 Yes Dionne Bucy, MD  cyclobenzaprine (FLEXERIL) 5 MG tablet Take 1 tablet (5 mg total) by mouth 3 (three) times daily as needed for muscle spasms. 01/06/15  Yes Menshew, Charlesetta Ivory, PA-C  folic acid (FOLVITE) 1 MG tablet Take 1 tablet (1 mg total) by mouth daily. 03/30/23  Yes Arnetha Courser, MD  gabapentin (NEURONTIN) 300 MG capsule Take 300 mg by mouth 3 (three) times daily.   Yes [provider]  hydrocortisone (ANUSOL-HC) 25 MG  suppository Place 1 suppository (25 mg total) rectally 2 (two) times daily as needed for hemorrhoids or anal itching. 03/29/23 03/28/24 Yes Arnetha Courser, MD  Multiple Vitamin (MULTIVITAMIN WITH MINERALS) TABS tablet Take 1 tablet by mouth daily. 03/30/23  Yes Arnetha Courser, MD  omeprazole (PRILOSEC) 20 MG capsule Take 1 capsule (20 mg total) by mouth daily. 03/10/23  Yes Debbe Odea, MD  thiamine (VITAMIN B-1) 100 MG tablet Take 1 tablet (100 mg total) by mouth daily. 03/30/23  Yes Arnetha Courser, MD    Allergies: No Known Allergies  Review of Systems: Review of Systems  Constitutional:  Negative for chills and fever.  Respiratory:  Negative for shortness of breath.   Cardiovascular:  Negative for chest pain.  Gastrointestinal:  Positive for blood in stool and constipation.    Physical Exam BP (!) 150/92   Pulse 73   Temp 98 F (36.7 C)   Ht 5\' 8"  (1.727 m)   Wt 191 lb (86.6 kg)   SpO2 98%   BMI 29.04 kg/m  CONSTITUTIONAL: No acute distress, well-nourished HEENT:  Normocephalic, atraumatic, extraocular motion intact. RESPIRATORY:  Normal respiratory effort without pathologic use of accessory muscles. CARDIOVASCULAR: Regular rhythm and rate. RECTAL: External exam is stable with no enlarged external hemorrhoidal tissue and no inflammatory changes or pain.  On digital rectal exam, exam also stable with enlarged left lateral and right anterior internal components without any gross blood noted today. NEUROLOGIC:  Motor and sensation is  grossly normal.  Cranial nerves are grossly intact. PSYCH:  Alert and oriented to person, place and time. Affect is normal.   Assessment and Plan: This is a 57 y.o. male with bleeding internal hemorrhoids.  - Discussed with patient that given no real change or improvement with conservative measures, the next step would be procedures to excise or ligate the hemorrhoid tissues.  Given that the main issue is with internal hemorrhoids and no concerns with  external hemorrhoid issues, I think he would be appropriate candidate for hemorrhoidal banding.  Discussed with patient that we will send a referral to gastroenterology for evaluation of this and potential banding.  Discussed with patient that for now we can also start adding either MiraLAX or stool softener to help with the consistency of the stools.  He can continue the Anusol and sitz bath and fiber. - May follow-up with [place] as needed.  I spent 20 minutes dedicated to the care of this patient on the date of this encounter to include pre-visit review of records, face-to-face time with the patient discussing diagnosis and management, and any post-visit coordination of care.   Howie Ill, MD [facility]

## 2023-05-16 ENCOUNTER — Encounter: Payer: Self-pay | Admitting: Gastroenterology

## 2023-05-16 ENCOUNTER — Ambulatory Visit (INDEPENDENT_AMBULATORY_CARE_PROVIDER_SITE_OTHER): Payer: 59 | Admitting: Gastroenterology

## 2023-05-16 VITALS — BP 143/99 | HR 80 | Temp 98.2°F | Ht 68.0 in | Wt 195.6 lb

## 2023-05-16 DIAGNOSIS — K648 Other hemorrhoids: Secondary | ICD-10-CM

## 2023-05-16 NOTE — Progress Notes (Signed)
Patient follow-ups today for banding of hemorrhoids    Summary of history :  Referred for banding of symptomatic internal hemorrhoids seen on colonoscopy on 03/28/2023: seen and referred by Dr Aleen Campi on 05/08/2023.  He has tried conservative measures and failed continues to have blood on the tissue paper and on the stool, perianal itching would like to get his hemorrhoids banded  Digital rectal exam performed in the presence of a chaperone. External anal findings: Normal Internal findings: , No masses, no blood on glove noticed.  chaperone present in the room  PROCEDURE NOTE: The patient presents with symptomatic grade 1 hemorrhoids, unresponsive to maximal medical therapy, requesting rubber band ligation of his/her hemorrhoidal disease.  All risks, benefits and alternative forms of therapy were described and informed consent was obtained.  In the Left Lateral Decubitus position (if anoscopy is performed) anoscopic examination revealed grade 1 hemorrhoids in the all position(s).   The decision was made to band the LL internal hemorrhoid, and the [facility] System was used to perform band ligation without complication.  Digital anorectal examination was then performed to assure proper positioning of the band, and to adjust the banded tissue as required.  The patient was discharged home without pain or other issues.  Dietary and behavioral recommendations were given and (if necessary - prescriptions were given), along with follow-up instructions.  The patient will return 4 weeks for follow-up and possible additional banding as required.  No complications were encountered and the patient tolerated the procedure well.   Plan:  Avoid constipation.    Follow-up: 4 weeks  Dr Wyline Mood MD,MRCP [facility]) Gastroenterology/Hepatology Pager: [phone]

## 2023-05-18 ENCOUNTER — Ambulatory Visit: Payer: 59 | Attending: Cardiology | Admitting: Cardiology

## 2023-05-19 ENCOUNTER — Encounter: Payer: Self-pay | Admitting: Cardiology

## 2023-06-03 ENCOUNTER — Emergency Department: Payer: 59

## 2023-06-03 ENCOUNTER — Other Ambulatory Visit: Payer: Self-pay

## 2023-06-03 ENCOUNTER — Emergency Department
Admission: EM | Admit: 2023-06-03 | Discharge: 2023-06-03 | Disposition: A | Discharge status: Home / Self Care | Payer: 59 | Attending: Student in an Organized Health Care Education/Training Program | Admitting: Student in an Organized Health Care Education/Training Program

## 2023-06-03 DIAGNOSIS — J9811 Atelectasis: Secondary | ICD-10-CM | POA: Diagnosis not present

## 2023-06-03 DIAGNOSIS — J45909 Unspecified asthma, uncomplicated: Secondary | ICD-10-CM | POA: Insufficient documentation

## 2023-06-03 DIAGNOSIS — I10 Essential (primary) hypertension: Secondary | ICD-10-CM | POA: Insufficient documentation

## 2023-06-03 DIAGNOSIS — R791 Abnormal coagulation profile: Secondary | ICD-10-CM | POA: Insufficient documentation

## 2023-06-03 DIAGNOSIS — R079 Chest pain, unspecified: Secondary | ICD-10-CM | POA: Diagnosis not present

## 2023-06-03 DIAGNOSIS — N281 Cyst of kidney, acquired: Secondary | ICD-10-CM | POA: Diagnosis not present

## 2023-06-03 DIAGNOSIS — R0789 Other chest pain: Secondary | ICD-10-CM

## 2023-06-03 DIAGNOSIS — K760 Fatty (change of) liver, not elsewhere classified: Secondary | ICD-10-CM | POA: Diagnosis not present

## 2023-06-03 LAB — COMPREHENSIVE METABOLIC PANEL
ALT: 72 U/L — ABNORMAL HIGH (ref 0–44)
AST: 137 U/L — ABNORMAL HIGH (ref 15–41)
Albumin: 4.3 g/dL (ref 3.5–5.0)
Alkaline Phosphatase: 84 U/L (ref 38–126)
Anion gap: 14 (ref 5–15)
BUN: 8 mg/dL (ref 6–20)
CO2: 20 mmol/L — ABNORMAL LOW (ref 22–32)
Calcium: 8.7 mg/dL — ABNORMAL LOW (ref 8.9–10.3)
Chloride: 108 mmol/L (ref 98–111)
Creatinine, Ser: 0.79 mg/dL (ref 0.61–1.24)
GFR, Estimated: >60 mL/min (ref >60)
Glucose, Bld: 87 mg/dL (ref 70–99)
Potassium: 3 mmol/L — ABNORMAL LOW (ref 3.5–5.1)
Sodium: 142 mmol/L (ref 135–145)
Total Bilirubin: 0.7 mg/dL (ref 0.3–1.2)
Total Protein: 7.6 g/dL (ref 6.5–8.1)

## 2023-06-03 LAB — CBC
HCT: 38 % — ABNORMAL LOW (ref 39.0–52.0)
Hemoglobin: 12.8 g/dL — ABNORMAL LOW (ref 13.0–17.0)
MCH: 34 pg (ref 26.0–34.0)
MCHC: 33.7 g/dL (ref 30.0–36.0)
MCV: 100.8 fL — ABNORMAL HIGH (ref 80.0–100.0)
Platelets: 186 10*3/uL (ref 150–400)
RBC: 3.77 MIL/uL — ABNORMAL LOW (ref 4.22–5.81)
RDW: 12.3 % (ref 11.5–15.5)
WBC: 5.8 10*3/uL (ref 4.0–10.5)
nRBC: 0 % (ref 0.0–0.2)

## 2023-06-03 LAB — D-DIMER, QUANTITATIVE: D-Dimer, Quant: 0.58 ug{FEU}/mL — ABNORMAL HIGH (ref 0.00–0.50)

## 2023-06-03 LAB — TROPONIN I (HIGH SENSITIVITY)
Troponin I (High Sensitivity): 10 ng/L (ref <18)
Troponin I (High Sensitivity): 9 ng/L (ref <18)

## 2023-06-03 MED ORDER — ALBUTEROL SULFATE HFA 108 (90 BASE) MCG/ACT IN AERS: 2.0000 | INHALATION_SPRAY | RESPIRATORY_TRACT | 0 refills | Status: AC | PRN

## 2023-06-03 MED ORDER — IOHEXOL 350 MG/ML SOLN
75.0000 mL | Freq: Once | INTRAVENOUS | Status: AC | PRN
  Administered 2023-06-03: 75 mL via INTRAVENOUS

## 2023-06-03 MED ORDER — ALUM & MAG HYDROXIDE-SIMETH 200-200-20 MG/5ML PO SUSP
30.0000 mL | Freq: Once | ORAL | Status: AC
  Administered 2023-06-03: 30 mL via ORAL
  Filled 2023-06-03: qty 30

## 2023-06-03 NOTE — ED Provider Notes (Signed)
[facility] Provider Note    Event Date/Time   First MD Initiated Contact with Patient 06/03/23 0700     (approximate)   History   Chest Pain   HPI  Ricardo Goodman is a 57 y.o. male history of hypertension hemorrhoids, asthma, alcohol use presents to the ER for evaluation of several months of daily chest discomfort.  Says he does feel some shortness of breath.  Does endorse drinking alcohol last night at a party.  Woke up this morning with chest discomfort.  Does have a documented remote history of DVT.  Not currently on any anticoagulation.     Physical Exam   Triage Vital Signs: ED Triage Vitals [06/03/23 0623]  Encounter Vitals Group     BP 108/76     Systolic BP Percentile      Diastolic BP Percentile      Pulse Rate 66     Resp 20     Temp 97.7 F (36.5 C)     Temp Source Oral     SpO2 94 %     Weight      Height      Head Circumference      Peak Flow      Pain Score 9     Pain Loc      Pain Education      Exclude from Growth Chart     Most recent vital signs: Vitals:   06/03/23 0700 06/03/23 0730  BP: 124/85 126/84  Pulse: 60 (!) 55  Resp: 16 17  Temp:    SpO2: 97% 98%     Constitutional: Alert  Eyes: Conjunctivae are normal.  Head: Atraumatic. Nose: No congestion/rhinnorhea. Mouth/Throat: Mucous membranes are moist.   Neck: Painless ROM.  Cardiovascular:   Good peripheral circulation. Respiratory: Normal respiratory effort.  No retractions.  Gastrointestinal: Soft and nontender.  Musculoskeletal:  no deformity Neurologic:  MAE spontaneously. No gross focal neurologic deficits are appreciated.  Skin:  Skin is warm, dry and intact. No rash noted. Psychiatric: Mood and affect are normal. Speech and behavior are normal.    ED Results / Procedures / Treatments   Labs (all labs ordered are listed, but only abnormal results are displayed) Labs Reviewed  CBC - Abnormal; Notable for the following components:       Result Value   RBC 3.77 (*)    Hemoglobin 12.8 (*)    HCT 38.0 (*)    MCV 100.8 (*)    All other components within normal limits  COMPREHENSIVE METABOLIC PANEL - Abnormal; Notable for the following components:   Potassium 3.0 (*)    CO2 20 (*)    Calcium 8.7 (*)    AST 137 (*)    ALT 72 (*)    All other components within normal limits  D-DIMER, QUANTITATIVE - Abnormal; Notable for the following components:   D-Dimer, Quant 0.58 (*)    All other components within normal limits  TROPONIN I (HIGH SENSITIVITY)  TROPONIN I (HIGH SENSITIVITY)     EKG  ED ECG REPORT I, Willy Eddy, the attending physician, personally viewed and interpreted this ECG.   Date: 06/03/2023  EKG Time: 6:21  Rate: 65  Rhythm: sinus  Axis: normal  Intervals: normal  ST&T Change: no stemi, no depressions    RADIOLOGY Please see ED Course for my review and interpretation.  I personally reviewed all radiographic images ordered to evaluate for the above acute complaints and reviewed radiology reports  and findings.  These findings were personally discussed with the patient.  Please see medical record for radiology report.    PROCEDURES:  Critical Care performed:   Procedures   MEDICATIONS ORDERED IN ED: Medications  alum & mag hydroxide-simeth (MAALOX/MYLANTA) 200-200-20 MG/5ML suspension 30 mL (30 mLs Oral Given 06/03/23 0732)  iohexol (OMNIPAQUE) 350 MG/ML injection 75 mL (75 mLs Intravenous Contrast Given 06/03/23 0841)     IMPRESSION / MDM / ASSESSMENT AND PLAN / ED COURSE  I reviewed the triage vital signs and the nursing notes.                              Differential diagnosis includes, but is not limited to, ACS, pericarditis, esophagitis, boerhaaves, pe, dissection, pna, bronchitis, costochondritis  Patient presenting to the ER for evaluation of symptoms as described above.  Based on symptoms, risk factors and considered above differential, this presenting complaint could  reflect a potentially life-threatening illness therefore the patient will be placed on continuous pulse oximetry and telemetry for monitoring.  Laboratory evaluation will be sent to evaluate for the above complaints.      Clinical Course as of 06/03/23 0915  Sat Jun 03, 2023  4098 D-dimer is evaded.  Will order CTA to further evaluate. [PR]  0912 CTA on my review and interpretation without evidence of PE.  Per radiology no acute findings.  Repeat troponin negative.  Patient remains hemodynamically stable well-appearing in no acute distress.  Does appear stable and appropriate for outpatient follow-up and referral. [PR]    Clinical Course User Index [PR] Willy Eddy, MD     FINAL CLINICAL IMPRESSION(S) / ED DIAGNOSES   Final diagnoses:  Atypical chest pain     Rx / DC Orders   ED Discharge Orders          Ordered    Ambulatory Referral to Primary Care (Establish Care)        06/03/23 0912    Ambulatory referral to Cardiology       Comments: If you have not heard from the Cardiology office within the next 72 hours please call [phone].   06/03/23 0912    albuterol (VENTOLIN HFA) 108 (90 Base) MCG/ACT inhaler  Every 4 hours PRN        06/03/23 0912             Note:  This document was prepared using Dragon voice recognition software and may include unintentional dictation errors.    Willy Eddy, MD 06/03/23 1200

## 2023-06-03 NOTE — ED Triage Notes (Signed)
Pt presents to ER via ems from home with c/o left side chest pain that woke him from his sleep this morning.  Pt states pain is on left side, and radiated into left arm and left side of neck.  Denies hx of heart disease.  Pt reports some sob that was relieved with his inhaler.  Pt states his pain was improved after receiving spray of nitroglycerin.  Pt also received 324 ASA.  Pt is currently A&O x4 and in NAD.

## 2023-06-12 ENCOUNTER — Encounter: Payer: Self-pay | Admitting: *Deleted

## 2023-06-12 ENCOUNTER — Ambulatory Visit: Payer: 59 | Admitting: Surgery

## 2023-06-13 ENCOUNTER — Ambulatory Visit: Payer: 59 | Admitting: Medical

## 2023-06-13 NOTE — Progress Notes (Deleted)
Cardiology Office Note:    Date:  06/13/2023   ID:  Ricardo Goodman, DOB [birth year], MRN [id number]  PCP:  Center, [facility] Cardiologist:  Debbe Odea, MD  [facility] Electrophysiologist:  None   Referring MD: Center, [facility]*   Chief Complaint: ***  History of Present Illness:    Ricardo Goodman is a 57 y.o. male with a hx of alcohol abuse, hypertension, tobacco use who is being seen for hospital follow-up.  Patient was initially seen in July 2024 as a new patient for chest pain that was nonexertional.  Recent ER workup had been negative for ACS.  Echo and cardiac CT were ordered and patient was started on a PPI.  Patient presented to the ER on March 27, 2023 with atypical chest pain.  High-sensitivity troponin was negative.  On was for outpatient echo and cardiac CTA as previously ordered.  Echo showed EF of 60 to 65%, mild to moderate MR.  Cardiac CTA showed a coronary calcium score of 0.  Today,  Past Medical History:  Diagnosis Date   DVT (deep venous thrombosis) (HCC)    Gastric ulcer 1990   Hypertension     Past Surgical History:  Procedure Laterality Date   ANKLE SURGERY     COLONOSCOPY WITH PROPOFOL N/A 03/28/2023   Procedure: COLONOSCOPY WITH PROPOFOL;  Surgeon: [name], MD;  Location: [facility] ENDOSCOPY;  Service: Endoscopy;  Laterality: N/A;   gastric ulcer surgery  1999   ORIF PROXIMAL TIBIOFIBULAR JOINT     POLYPECTOMY  03/28/2023   Procedure: POLYPECTOMY;  Surgeon: [name], MD;  Location: [facility] ENDOSCOPY;  Service: Endoscopy;;    Current Medications: No outpatient medications have been marked as taking for the 06/13/23 encounter (Appointment) with Fransico Michael, Nawaal Alling H, PA-C.     Allergies:   Patient has no known allergies.   Social History   Socioeconomic History   Marital status: Married    Spouse name: Not on file   Number of children: Not on file   Years of education: Not on file   Highest  education level: Not on file  Occupational History   Not on file  Tobacco Use   Smoking status: Never    Passive exposure: Never   Smokeless tobacco: Never  Vaping Use   Vaping status: Never Used  Substance and Sexual Activity   Alcohol use: Yes    Comment: occ   Drug use: No   Sexual activity: Yes  Other Topics Concern   Not on file  Social History Narrative   Not on file   Social Determinants of Health   Financial Resource Strain: Not on file  Food Insecurity: No Food Insecurity (03/27/2023)   Hunger Vital Sign    Worried About Running Out of Food in the Last Year: Never true    Ran Out of Food in the Last Year: Never true  Transportation Needs: No Transportation Needs (03/27/2023)   PRAPARE - Administrator, Civil Service (Medical): No    Lack of Transportation (Non-Medical): No  Physical Activity: Not on file  Stress: Not on file  Social Connections: Not on file     Family History: The patient's ***family history is not on file.  ROS:   Please see the history of present illness.    *** All other systems reviewed and are negative.  EKGs/Labs/Other Studies Reviewed:    The following studies were reviewed today: ***  EKG:  EKG is ***  ordered today.  The ekg ordered today demonstrates ***  Recent Labs: 03/27/2023: B Natriuretic Peptide 12.9; Magnesium 2.0 06/03/2023: ALT 72; BUN 8; Creatinine, Ser 0.79; Hemoglobin 12.8; Platelets 186; Potassium 3.0; Sodium 142  Recent Lipid Panel    Component Value Date/Time   CHOL 303 (H) 03/28/2023 0420   TRIG 91 03/28/2023 0420   HDL 91 03/28/2023 0420   CHOLHDL 3.3 03/28/2023 0420   VLDL 18 03/28/2023 0420   LDLCALC 194 (H) 03/28/2023 0420     Risk Assessment/Calculations:   {Does this patient have ATRIAL FIBRILLATION?:[id number]}   Physical Exam:    VS:  There were no vitals taken for this visit.    Wt Readings from Last 3 Encounters:  05/16/23 195 lb 9.6 oz (88.7 kg)  05/08/23 191 lb (86.6 kg)   04/10/23 196 lb 6.4 oz (89.1 kg)     GEN: *** Well nourished, well developed in no acute distress HEENT: Normal NECK: No JVD; No carotid bruits LYMPHATICS: No lymphadenopathy CARDIAC: ***RRR, no murmurs, rubs, gallops RESPIRATORY:  Clear to auscultation without rales, wheezing or rhonchi  ABDOMEN: Soft, non-tender, non-distended MUSCULOSKELETAL:  No edema; No deformity  SKIN: Warm and dry NEUROLOGIC:  Alert and oriented x 3 PSYCHIATRIC:  Normal affect   ASSESSMENT:    No diagnosis found. PLAN:    In order of problems listed above:  ***  Disposition: Follow up {follow up:15908} with ***   Shared Decision Making/Informed Consent   {Are you ordering a CV Procedure (e.g. stress test, cath, DCCV, TEE, etc)?   Press F2        :[id number]}    Signed, Arlon Bleier Ardelle Lesches  06/13/2023 7:57 AM    [facility]

## 2023-06-26 ENCOUNTER — Ambulatory Visit: Payer: 59 | Admitting: Gastroenterology

## 2023-07-04 ENCOUNTER — Ambulatory Visit: Payer: 59 | Attending: Medical | Admitting: Medical

## 2023-07-04 NOTE — Progress Notes (Deleted)
Cardiology Office Note:    Date:  07/04/2023   ID:  Ricardo Goodman, DOB 14-Jun-1966, MRN [id number]  PCP:  Center, [facility] Cardiologist:  Debbe Odea, MD  [facility] Electrophysiologist:  None   Referring MD: Center, [facility]*   Chief Complaint: Hospital follow-up  History of Present Illness:    Ricardo Goodman is a 57 y.o. male with a hx of alcohol abuse, hypertension, tobacco abuse is being seen for hospital follow-up.  Patient was seen as a new patient July 2024 with chest pain.  Echo and cardiac CTA were ordered.  Patient presented to the ER on March 27, 2023 with chest pain.  Patient reported drinking 6 pack of beer and a pint of liquor earlier in the day.  High-sensitivity troponin negative x 2.  Plan was to continue outpatient workup with echo and cardiac CTA.  Cardiac CTA showed coronary calcium score of 0, no evidence of CAD.  Echo showed LVEF 60 to 65%, normal RV SF, mildly elevated pulmonary artery systolic pressure, mild to moderate MR.  Today,    Past Medical History:  Diagnosis Date   DVT (deep venous thrombosis) (HCC)    Gastric ulcer 1990   Hypertension     Past Surgical History:  Procedure Laterality Date   ANKLE SURGERY     COLONOSCOPY WITH PROPOFOL N/A 03/28/2023   Procedure: COLONOSCOPY WITH PROPOFOL;  Surgeon: [name], MD;  Location: [facility] ENDOSCOPY;  Service: Endoscopy;  Laterality: N/A;   gastric ulcer surgery  1999   ORIF PROXIMAL TIBIOFIBULAR JOINT     POLYPECTOMY  03/28/2023   Procedure: POLYPECTOMY;  Surgeon: [name], MD;  Location: [facility] ENDOSCOPY;  Service: Endoscopy;;    Current Medications: No outpatient medications have been marked as taking for the 07/04/23 encounter (Appointment) with Fransico Michael, Anquan Azzarello H, PA-C.     Allergies:   Patient has no known allergies.   Social History   Socioeconomic History   Marital status: Married    Spouse name: Not on file   Number of children: Not  on file   Years of education: Not on file   Highest education level: Not on file  Occupational History   Not on file  Tobacco Use   Smoking status: Never    Passive exposure: Never   Smokeless tobacco: Never  Vaping Use   Vaping status: Never Used  Substance and Sexual Activity   Alcohol use: Yes    Comment: occ   Drug use: No   Sexual activity: Yes  Other Topics Concern   Not on file  Social History Narrative   Not on file   Social Determinants of Health   Financial Resource Strain: Not on file  Food Insecurity: No Food Insecurity (03/27/2023)   Hunger Vital Sign    Worried About Running Out of Food in the Last Year: Never true    Ran Out of Food in the Last Year: Never true  Transportation Needs: No Transportation Needs (03/27/2023)   PRAPARE - Administrator, Civil Service (Medical): No    Lack of Transportation (Non-Medical): No  Physical Activity: Not on file  Stress: Not on file  Social Connections: Not on file     Family History: The patient's ***family history is not on file.  ROS:   Please see the history of present illness.    *** All other systems reviewed and are negative.  EKGs/Labs/Other Studies Reviewed:    The following studies were  reviewed today: ***  EKG:  EKG is *** ordered today.  The ekg ordered today demonstrates ***  Recent Labs: 03/27/2023: B Natriuretic Peptide 12.9; Magnesium 2.0 06/03/2023: ALT 72; BUN 8; Creatinine, Ser 0.79; Hemoglobin 12.8; Platelets 186; Potassium 3.0; Sodium 142  Recent Lipid Panel    Component Value Date/Time   CHOL 303 (H) 03/28/2023 0420   TRIG 91 03/28/2023 0420   HDL 91 03/28/2023 0420   CHOLHDL 3.3 03/28/2023 0420   VLDL 18 03/28/2023 0420   LDLCALC 194 (H) 03/28/2023 0420     Risk Assessment/Calculations:   {Does this patient have ATRIAL FIBRILLATION?[phone]}   Physical Exam:    VS:  There were no vitals taken for this visit.    Wt Readings from Last 3 Encounters:  05/16/23 195  lb 9.6 oz (88.7 kg)  05/08/23 191 lb (86.6 kg)  04/10/23 196 lb 6.4 oz (89.1 kg)     GEN: *** Well nourished, well developed in no acute distress HEENT: Normal NECK: No JVD; No carotid bruits LYMPHATICS: No lymphadenopathy CARDIAC: ***RRR, no murmurs, rubs, gallops RESPIRATORY:  Clear to auscultation without rales, wheezing or rhonchi  ABDOMEN: Soft, non-tender, non-distended MUSCULOSKELETAL:  No edema; No deformity  SKIN: Warm and dry NEUROLOGIC:  Alert and oriented x 3 PSYCHIATRIC:  Normal affect   ASSESSMENT:    No diagnosis found. PLAN:    In order of problems listed above:  ***  Disposition: Follow up {follow up:15908} with ***   Shared Decision Making/Informed Consent   {Are you ordering a CV Procedure (e.g. stress test, cath, DCCV, TEE, etc)?   Press F2        :[id number]}    Signed, Shahab Polhamus Ardelle Lesches  07/04/2023 9:36 AM    [facility]

## 2023-08-29 ENCOUNTER — Observation Stay
Admission: EM | Admit: 2023-08-29 | Discharge: 2023-08-31 | Disposition: A | Discharge status: Home / Self Care | Payer: 59 | Attending: Student | Admitting: Student

## 2023-08-29 ENCOUNTER — Other Ambulatory Visit: Payer: Self-pay

## 2023-08-29 ENCOUNTER — Emergency Department: Payer: 59

## 2023-08-29 DIAGNOSIS — R42 Dizziness and giddiness: Secondary | ICD-10-CM | POA: Diagnosis not present

## 2023-08-29 DIAGNOSIS — R079 Chest pain, unspecified: Principal | ICD-10-CM | POA: Insufficient documentation

## 2023-08-29 DIAGNOSIS — I10 Essential (primary) hypertension: Secondary | ICD-10-CM | POA: Diagnosis not present

## 2023-08-29 DIAGNOSIS — R000 Tachycardia, unspecified: Secondary | ICD-10-CM | POA: Diagnosis not present

## 2023-08-29 DIAGNOSIS — D649 Anemia, unspecified: Secondary | ICD-10-CM | POA: Diagnosis not present

## 2023-08-29 DIAGNOSIS — I471 Supraventricular tachycardia, unspecified: Secondary | ICD-10-CM | POA: Diagnosis not present

## 2023-08-29 DIAGNOSIS — K921 Melena: Secondary | ICD-10-CM

## 2023-08-29 DIAGNOSIS — E876 Hypokalemia: Secondary | ICD-10-CM | POA: Insufficient documentation

## 2023-08-29 DIAGNOSIS — F320 Major depressive disorder, single episode, mild: Secondary | ICD-10-CM | POA: Diagnosis not present

## 2023-08-29 DIAGNOSIS — Z743 Need for continuous supervision: Secondary | ICD-10-CM | POA: Diagnosis not present

## 2023-08-29 DIAGNOSIS — Z0130 Encounter for examination of blood pressure without abnormal findings: Secondary | ICD-10-CM | POA: Diagnosis not present

## 2023-08-29 DIAGNOSIS — F1010 Alcohol abuse, uncomplicated: Secondary | ICD-10-CM | POA: Diagnosis not present

## 2023-08-29 DIAGNOSIS — R0602 Shortness of breath: Secondary | ICD-10-CM | POA: Diagnosis not present

## 2023-08-29 DIAGNOSIS — Z1331 Encounter for screening for depression: Secondary | ICD-10-CM | POA: Diagnosis not present

## 2023-08-29 DIAGNOSIS — K922 Gastrointestinal hemorrhage, unspecified: Secondary | ICD-10-CM

## 2023-08-29 LAB — BASIC METABOLIC PANEL
Anion gap: 14 (ref 5–15)
Anion gap: 11 (ref 5–15)
BUN: 11 mg/dL (ref 6–20)
BUN: 14 mg/dL (ref 6–20)
CO2: 20 mmol/L — ABNORMAL LOW (ref 22–32)
CO2: 20 mmol/L — ABNORMAL LOW (ref 22–32)
Calcium: 8.8 mg/dL — ABNORMAL LOW (ref 8.9–10.3)
Calcium: 9.1 mg/dL (ref 8.9–10.3)
Chloride: 104 mmol/L (ref 98–111)
Chloride: 103 mmol/L (ref 98–111)
Creatinine, Ser: 1.09 mg/dL (ref 0.61–1.24)
Creatinine, Ser: 0.85 mg/dL (ref 0.61–1.24)
GFR, Estimated: >60 mL/min (ref >60)
GFR, Estimated: >60 mL/min (ref >60)
Glucose, Bld: 132 mg/dL — ABNORMAL HIGH (ref 70–99)
Glucose, Bld: 112 mg/dL — ABNORMAL HIGH (ref 70–99)
Potassium: 2.8 mmol/L — ABNORMAL LOW (ref 3.5–5.1)
Potassium: 3.3 mmol/L — ABNORMAL LOW (ref 3.5–5.1)
Sodium: 138 mmol/L (ref 135–145)
Sodium: 134 mmol/L — ABNORMAL LOW (ref 135–145)

## 2023-08-29 LAB — PHOSPHORUS: Phosphorus: 3.4 mg/dL (ref 2.5–4.6)

## 2023-08-29 LAB — CBC
HCT: 31.6 % — ABNORMAL LOW (ref 39.0–52.0)
Hemoglobin: 10.6 g/dL — ABNORMAL LOW (ref 13.0–17.0)
MCH: 32.4 pg (ref 26.0–34.0)
MCHC: 33.5 g/dL (ref 30.0–36.0)
MCV: 96.6 fL (ref 80.0–100.0)
Platelets: 199 10*3/uL (ref 150–400)
RBC: 3.27 MIL/uL — ABNORMAL LOW (ref 4.22–5.81)
RDW: 13 % (ref 11.5–15.5)
WBC: 11 10*3/uL — ABNORMAL HIGH (ref 4.0–10.5)
nRBC: 0.2 % (ref 0.0–0.2)

## 2023-08-29 LAB — ETHANOL: Alcohol, Ethyl (B): <10 mg/dL (ref <10)

## 2023-08-29 LAB — TROPONIN I (HIGH SENSITIVITY)
Troponin I (High Sensitivity): 29 ng/L — ABNORMAL HIGH (ref <18)
Troponin I (High Sensitivity): 44 ng/L — ABNORMAL HIGH (ref <18)
Troponin I (High Sensitivity): 50 ng/L — ABNORMAL HIGH

## 2023-08-29 LAB — MAGNESIUM: Magnesium: 1.6 mg/dL — ABNORMAL LOW (ref 1.7–2.4)

## 2023-08-29 MED ORDER — LORAZEPAM 2 MG/ML IJ SOLN
1.0000 mg | INTRAMUSCULAR | Status: DC | PRN
Start: 2023-08-29 — End: 2023-09-01

## 2023-08-29 MED ORDER — FENTANYL CITRATE PF 50 MCG/ML IJ SOSY
50.0000 ug | PREFILLED_SYRINGE | Freq: Once | INTRAMUSCULAR | Status: AC
  Administered 2023-08-29: 50 ug via INTRAVENOUS
  Filled 2023-08-29: qty 1

## 2023-08-29 MED ORDER — ADULT MULTIVITAMIN W/MINERALS CH
1.0000 | ORAL_TABLET | Freq: Every day | ORAL | Status: DC
  Administered 2023-08-29 – 2023-08-31 (×3): 1 via ORAL
  Filled 2023-08-29 (×3): qty 1

## 2023-08-29 MED ORDER — MORPHINE SULFATE (PF) 2 MG/ML IV SOLN
2.0000 mg | Freq: Once | INTRAVENOUS | Status: AC
  Administered 2023-08-29: 2 mg via INTRAVENOUS
  Filled 2023-08-29: qty 1

## 2023-08-29 MED ORDER — FOLIC ACID 1 MG PO TABS
1.0000 mg | ORAL_TABLET | Freq: Every day | ORAL | Status: DC
  Administered 2023-08-29 – 2023-08-31 (×3): 1 mg via ORAL
  Filled 2023-08-29 (×3): qty 1

## 2023-08-29 MED ORDER — POTASSIUM CHLORIDE 10 MEQ/100ML IV SOLN
10.0000 meq | INTRAVENOUS | Status: AC
  Administered 2023-08-29: 10 meq via INTRAVENOUS
  Filled 2023-08-29: qty 100

## 2023-08-29 MED ORDER — THIAMINE MONONITRATE 100 MG PO TABS
100.0000 mg | ORAL_TABLET | Freq: Every day | ORAL | Status: DC
  Administered 2023-08-29 – 2023-08-30 (×2): 100 mg via ORAL
  Filled 2023-08-29 (×2): qty 1

## 2023-08-29 MED ORDER — POTASSIUM CHLORIDE 20 MEQ PO PACK
20.0000 meq | PACK | Freq: Once | ORAL | Status: AC
  Administered 2023-08-30: 20 meq via ORAL
  Filled 2023-08-29: qty 1

## 2023-08-29 MED ORDER — ENOXAPARIN SODIUM 40 MG/0.4ML IJ SOSY
40.0000 mg | PREFILLED_SYRINGE | INTRAMUSCULAR | Status: DC
  Administered 2023-08-29: 40 mg via SUBCUTANEOUS
  Filled 2023-08-29: qty 0.4

## 2023-08-29 MED ORDER — ONDANSETRON HCL 4 MG PO TABS: 4.0000 mg | ORAL_TABLET | Freq: Four times a day (QID) | ORAL | Status: DC | PRN

## 2023-08-29 MED ORDER — LORAZEPAM 1 MG PO TABS: 1.0000 mg | ORAL_TABLET | ORAL | Status: DC | PRN

## 2023-08-29 MED ORDER — NITROGLYCERIN 0.4 MG SL SUBL
0.4000 mg | SUBLINGUAL_TABLET | SUBLINGUAL | Status: AC | PRN
  Administered 2023-08-29 (×3): 0.4 mg via SUBLINGUAL
  Filled 2023-08-29: qty 1

## 2023-08-29 MED ORDER — SODIUM CHLORIDE 0.9 % IV BOLUS
500.0000 mL | Freq: Once | INTRAVENOUS | Status: AC
  Administered 2023-08-29: 500 mL via INTRAVENOUS

## 2023-08-29 MED ORDER — POTASSIUM CHLORIDE IN NACL 20-0.9 MEQ/L-% IV SOLN
INTRAVENOUS | Status: AC
  Filled 2023-08-29 (×2): qty 1000

## 2023-08-29 MED ORDER — THIAMINE HCL 100 MG/ML IJ SOLN
100.0000 mg | Freq: Every day | INTRAMUSCULAR | Status: DC
  Administered 2023-08-31: 100 mg via INTRAVENOUS
  Filled 2023-08-29: qty 2

## 2023-08-29 MED ORDER — POTASSIUM CHLORIDE 20 MEQ PO PACK
40.0000 meq | PACK | Freq: Once | ORAL | Status: AC
  Administered 2023-08-29: 40 meq via ORAL
  Filled 2023-08-29: qty 2

## 2023-08-29 MED ORDER — ONDANSETRON HCL 4 MG/2ML IJ SOLN: 4.0000 mg | Freq: Four times a day (QID) | INTRAMUSCULAR | Status: DC | PRN

## 2023-08-29 MED ORDER — MAGNESIUM SULFATE 2 GM/50ML IV SOLN
2.0000 g | Freq: Once | INTRAVENOUS | Status: AC
  Administered 2023-08-29: 2 g via INTRAVENOUS
  Filled 2023-08-29: qty 50

## 2023-08-29 NOTE — Consult Note (Signed)
 PHARMACY CONSULT NOTE - FOLLOW UP  Pharmacy Consult for Electrolyte Monitoring and Replacement   Recent Labs: Potassium (mmol/L)  Date Value  08/29/2023 3.3 (L)  06/03/2013 4.1   Magnesium  (mg/dL)  Date Value  98/92/7974 1.6 (L)   Calcium (mg/dL)  Date Value  98/92/7974 9.1   Calcium, Total (mg/dL)  Date Value  89/86/7985 9.9   Albumin (g/dL)  Date Value  89/87/7975 4.3  10/06/2012 4.4   Phosphorus (mg/dL)  Date Value  98/92/7974 3.4   Sodium (mmol/L)  Date Value  08/29/2023 134 (L)  06/03/2013 138   Assessment: Ricardo Goodman is a 24 male who presented with chest pain, SVT and HR into the 170s. Troponins in 20s. EKG with normal sinus rhythm. Repeat ECHO planned. Patient will be placed on CIWA protocol. Pharmacy has been consulted to manage electrolytes.  Goal of Therapy:  Electrolytes within normal limits K = 3.3 Mg = pending  Phos = pending   Plan:  Give 20 mEq Kcl PO x 1 dose Re-check BMP, Mg, and Phos with AM labs   Ricardo Goodman, PharmD, [facility] 08/29/2023 10:44 PM

## 2023-08-29 NOTE — ED Notes (Signed)
 See triage note  Presents via EMS from PCP office  States he went from his regular check up  but also had some chest pain last pm  while at the office this am they noticed a rapid heart rate

## 2023-08-29 NOTE — Assessment & Plan Note (Signed)
 BP stable Titrate home regimen

## 2023-08-29 NOTE — Assessment & Plan Note (Addendum)
 Patient reports drinking 2+ beers daily Likely confounder to episode of SVT  Will place on CIWA protocol EtOH level Discussed alcohol cessation Monitor

## 2023-08-29 NOTE — H&P (Signed)
 History and Physical    Patient: Ricardo Goodman DOB: 09/07/1965 DOA: 08/29/2023 DOS: the patient was seen and examined on 08/29/2023 PCP: Center, [facility]  Patient coming from:  Clinic  Chief Complaint:  Chief Complaint  Patient presents with   Chest Pain   HPI: Ricardo Goodman is a 58 y.o. male with medical history significant of alcohol abuse, hypertension, tobacco abuse presenting with chest pain, SVT.  Patient reports episodes of chest pain from this morning.  Chest pain is central in nature.  No reported radiation.  Chest pain sometimes worse with movement and deep breathing.  No shortness of breath.  No nausea or vomiting.  No focal hemiparesis or confusion.  Noted baseline alcohol abuse.  Was admitted in August for similar issues associated with chest pain.  Had formal evaluation with cardiology.  Had coronary CT with a calcium score of 0.  Patient states he is cut back on drinking.  Still drinking at least 2 beers daily.  Denies any illicit drug use.  Still smoking cigarettes.  Presented to clinic with complaints of chest pain.  Heart rate noted to be in the 170s.  Had clinic EKG showing SVT with heart rate into the 170s.  EMS subsequently called.  Valsalva maneuver initiated with patient blowing through a straw and subsequent resolution of SVT. Presented to the ER afebrile, heart rate low 100s, SBP 70s improved to 100s with IV fluids.  Satting well on room air.  White count 11, hemoglobin 10.6, platelets 199, creatinine 1.09, potassium 2.8.  Troponin 29.  EKG, sinus rhythm. Review of Systems: As mentioned in the history of present illness. All other systems reviewed and are negative. Past Medical History:  Diagnosis Date   DVT (deep venous thrombosis) (HCC)    Gastric ulcer 1990   Hypertension    Past Surgical History:  Procedure Laterality Date   ANKLE SURGERY     COLONOSCOPY WITH PROPOFOL  N/A 03/28/2023   Procedure: COLONOSCOPY WITH PROPOFOL ;  Surgeon:  Jinny Carmine, MD;  Location: [facility] ENDOSCOPY;  Service: Endoscopy;  Laterality: N/A;   gastric ulcer surgery  1999   ORIF PROXIMAL TIBIOFIBULAR JOINT     POLYPECTOMY  03/28/2023   Procedure: POLYPECTOMY;  Surgeon: Jinny Carmine, MD;  Location: [facility] ENDOSCOPY;  Service: Endoscopy;;   Social History:  reports that he has never smoked. He has never been exposed to tobacco smoke. He has never used smokeless tobacco. He reports current alcohol use. He reports that he does not use drugs.  No Known Allergies  History reviewed. No pertinent family history.  Prior to Admission medications   Medication Sig Start Date End Date Taking? Authorizing Provider  albuterol  (VENTOLIN  HFA) 108 (90 Base) MCG/ACT inhaler Inhale 2 puffs into the lungs every 4 (four) hours as needed for wheezing or shortness of breath. 06/03/23  Yes Lang Dover, MD  amLODipine  (NORVASC ) 5 MG tablet Take 1 tablet (5 mg total) by mouth daily. 01/29/23 01/29/24 Yes Jacolyn Pae, MD  omeprazole  (PRILOSEC) 20 MG capsule Take 1 capsule (20 mg total) by mouth daily. 03/10/23  Yes Agbor-Etang, Redell, MD  cyclobenzaprine  (FLEXERIL ) 5 MG tablet Take 1 tablet (5 mg total) by mouth 3 (three) times daily as needed for muscle spasms. Patient not taking: Reported on 08/29/2023 01/06/15   Menshew, Candida LULLA Kings, PA-C  folic acid  (FOLVITE ) 1 MG tablet Take 1 tablet (1 mg total) by mouth daily. 03/30/23   Caleen Qualia, MD  gabapentin  (NEURONTIN ) 300 MG capsule Take  300 mg by mouth 3 (three) times daily. Patient not taking: Reported on 08/29/2023    [provider]  hydrocortisone  (ANUSOL -HC) 25 MG suppository Place 1 suppository (25 mg total) rectally 2 (two) times daily as needed for hemorrhoids or anal itching. Patient not taking: Reported on 08/29/2023 03/29/23 03/28/24  Amin, Sumayya, MD  Multiple Vitamin (MULTIVITAMIN WITH MINERALS) TABS tablet Take 1 tablet by mouth daily. Patient not taking: Reported on 08/29/2023 03/30/23   Amin, Sumayya, MD   thiamine  (VITAMIN B-1) 100 MG tablet Take 1 tablet (100 mg total) by mouth daily. 03/30/23   Caleen Qualia, MD    Physical Exam: Vitals:   08/29/23 1224 08/29/23 1225 08/29/23 1335  BP: (!) 70/44  103/74  Pulse: (!) 104    Resp: 16    Temp: 98.4 F (36.9 C)    TempSrc: Oral    SpO2: 96%    Weight:  86.2 kg   Height:  5' 8 (1.727 m)    Physical Exam Constitutional:      Appearance: He is normal weight.  HENT:     Head: Normocephalic and atraumatic.     Nose: Nose normal.     Mouth/Throat:     Mouth: Mucous membranes are moist.  Eyes:     Pupils: Pupils are equal, round, and reactive to light.  Cardiovascular:     Rate and Rhythm: Normal rate and regular rhythm.  Pulmonary:     Effort: Pulmonary effort is normal.  Abdominal:     General: Bowel sounds are normal.  Musculoskeletal:        General: Normal range of motion.     Comments: + chest wall TTP    Skin:    General: Skin is warm.  Neurological:     General: No focal deficit present.  Psychiatric:        Mood and Affect: Mood normal.     Data Reviewed:  There are no new results to review at this time.  DG Chest 2 View CLINICAL DATA:  Chest pain, shortness of breath.  EXAM: CHEST - 2 VIEW  COMPARISON:  June 03, 2023.  FINDINGS: The heart size and mediastinal contours are within normal limits. Both lungs are clear. The visualized skeletal structures are unremarkable.  IMPRESSION: No active cardiopulmonary disease.  Electronically Signed   By: Lynwood Landy Raddle M.D.   On: 08/29/2023 13:13  Lab Results  Component Value Date   WBC 11.0 (H) 08/29/2023   HGB 10.6 (L) 08/29/2023   HCT 31.6 (L) 08/29/2023   MCV 96.6 08/29/2023   PLT 199 08/29/2023   Last metabolic panel Lab Results  Component Value Date   GLUCOSE 132 (H) 08/29/2023   NA 138 08/29/2023   K 2.8 (L) 08/29/2023   CL 104 08/29/2023   CO2 20 (L) 08/29/2023   BUN 11 08/29/2023   CREATININE 1.09 08/29/2023   GFRNONAA >60  08/29/2023   CALCIUM 8.8 (L) 08/29/2023   PROT 7.6 06/03/2023   ALBUMIN 4.3 06/03/2023   BILITOT 0.7 06/03/2023   ALKPHOS 84 06/03/2023   AST 137 (H) 06/03/2023   ALT 72 (H) 06/03/2023   ANIONGAP 14 08/29/2023    Assessment and Plan: * Chest pain Recurrent chest pain w/ noted episode of SVT w/ HR into 170s- resolved s/p valsalva maneuver w/ EMS  Does have pleuritic component Noted admission August 2024 for similar issues with coronary CT showing calcium score of 0 Troponin 20s; follow-up EKG with normal sinus rhythm Case preliminary  discussed with cardiology Will trend troponin overnight Repeat 2D echo Follow    Alcohol abuse Patient reports drinking 2+ beers daily Likely confounder to episode of SVT  Will place on CIWA protocol EtOH level Discussed alcohol cessation Monitor  HTN (hypertension) BP stable  Titrate home regimen        Advance Care Planning:   Code Status: Prior   Consults: None   Family Communication: No family at the bedside   Severity of Illness: The appropriate patient status for this patient is OBSERVATION. Observation status is judged to be reasonable and necessary in order to provide the required intensity of service to ensure the patient's safety. The patient's presenting symptoms, physical exam findings, and initial radiographic and laboratory data in the context of their medical condition is felt to place them at decreased risk for further clinical deterioration. Furthermore, it is anticipated that the patient will be medically stable for discharge from the hospital within 2 midnights of admission.   Author: Elspeth JINNY Masters, MD 08/29/2023 3:48 PM  For on call review [email or web address].

## 2023-08-29 NOTE — ED Provider Notes (Signed)
 [facility] Provider Note    Event Date/Time   First MD Initiated Contact with Patient 08/29/23 1249     (approximate)   History   Chest Pain   HPI  Ricardo Goodman is a 58 y.o. male history of hypertension with, alcohol use who presents with complaints of chest discomfort.  Patient reports yesterday he felt lightheaded, chest discomfort, this continued today.  He went to his PCP found to be in SVT, EMS was able to break SVT T.  He continues to have chest discomfort and lightheadedness particular when he stands     Physical Exam   Triage Vital Signs: ED Triage Vitals  Encounter Vitals Group     BP 08/29/23 1224 (!) 70/44     Systolic BP Percentile --      Diastolic BP Percentile --      Pulse Rate 08/29/23 1224 (!) 104     Resp 08/29/23 1224 16     Temp 08/29/23 1224 98.4 F (36.9 C)     Temp Source 08/29/23 1224 Oral     SpO2 08/29/23 1224 96 %     Weight 08/29/23 1225 86.2 kg (190 lb)     Height 08/29/23 1225 1.727 m (5' 8)     Head Circumference --      Peak Flow --      Pain Score 08/29/23 1225 8     Pain Loc --      Pain Education --      Exclude from Growth Chart --     Most recent vital signs: Vitals:   08/29/23 1224 08/29/23 1335  BP: (!) 70/44 103/74  Pulse: (!) 104   Resp: 16   Temp: 98.4 F (36.9 C)   SpO2: 96%      General: Awake, no distress.  CV:  Good peripheral perfusion.  Regular rate and rhythm Resp:  Normal effort.  Abd:  No distention.  Other:  No calf pain or swelling   ED Results / Procedures / Treatments   Labs (all labs ordered are listed, but only abnormal results are displayed) Labs Reviewed  BASIC METABOLIC PANEL - Abnormal; Notable for the following components:      Result Value   Potassium 2.8 (*)    CO2 20 (*)    Glucose, Bld 132 (*)    Calcium 8.8 (*)    All other components within normal limits  CBC - Abnormal; Notable for the following components:   WBC 11.0 (*)    RBC 3.27 (*)     Hemoglobin 10.6 (*)    HCT 31.6 (*)    All other components within normal limits  TROPONIN I (HIGH SENSITIVITY) - Abnormal; Notable for the following components:   Troponin I (High Sensitivity) 29 (*)    All other components within normal limits  TROPONIN I (HIGH SENSITIVITY)     EKG  ED ECG REPORT I, Lamar Price, the attending physician, personally viewed and interpreted this ECG.  Date: 08/29/2023  Rhythm: normal sinus rhythm QRS Axis: normal Intervals: normal ST/T Wave abnormalities: Nonspecific changes Narrative Interpretation: no evidence of acute ischemia    RADIOLOGY Chest x-ray viewed interpret by me, no acute abnormality    PROCEDURES:  Critical Care performed: yes  CRITICAL CARE Performed by: Lamar Price   Total critical care time: 30 minutes  Critical care time was exclusive of separately billable procedures and treating other patients.  Critical care was necessary to treat or prevent imminent  or life-threatening deterioration.  Critical care was time spent personally by me on the following activities: development of treatment plan with patient and/or surrogate as well as nursing, discussions with consultants, evaluation of patient's response to treatment, examination of patient, obtaining history from patient or surrogate, ordering and performing treatments and interventions, ordering and review of laboratory studies, ordering and review of radiographic studies, pulse oximetry and re-evaluation of patient's condition. 2  Procedures   MEDICATIONS ORDERED IN ED: Medications  sodium chloride  0.9 % bolus 500 mL (500 mLs Intravenous New Bag/Given 08/29/23 1349)  fentaNYL  (SUBLIMAZE ) injection 50 mcg (50 mcg Intravenous Given 08/29/23 1400)     IMPRESSION / MDM / ASSESSMENT AND PLAN / ED COURSE  I reviewed the triage vital signs and the nursing notes. Patient's presentation is most consistent with acute presentation with potential threat to life or  bodily function.   Patient with a history of hypertension presents with chest pain and arrhythmia.  Arrhythmia has improved but chest pain continues.  EKG without evidence of ST elevation.  However his troponin is elevated at 29  Initial blood pressure of 70/44, this improved with IV fluids  Will give a dose of IV fentanyl  he has received 324 of aspirin   Have discussed with the hospitalist for admission       FINAL CLINICAL IMPRESSION(S) / ED DIAGNOSES   Final diagnoses:  Nonspecific chest pain     Rx / DC Orders   ED Discharge Orders     None        Note:  This document was prepared using Dragon voice recognition software and may include unintentional dictation errors.   Arlander Charleston, MD 08/29/23 1459

## 2023-08-29 NOTE — Assessment & Plan Note (Signed)
 Recurrent chest pain w/ noted episode of SVT w/ HR into 170s- resolved s/p valsalva maneuver w/ EMS  Does have pleuritic component Noted admission August 2024 for similar issues with coronary CT showing calcium score of 0 Troponin 20s; follow-up EKG with normal sinus rhythm Case preliminary discussed with cardiology Will trend troponin overnight Repeat 2D echo Follow

## 2023-08-29 NOTE — Assessment & Plan Note (Signed)
 Passing 2.8 Check mag level Replete

## 2023-08-29 NOTE — Consult Note (Signed)
 PHARMACY CONSULT NOTE - FOLLOW UP  Pharmacy Consult for Electrolyte Monitoring and Replacement   Recent Labs: Potassium (mmol/L)  Date Value  08/29/2023 2.8 (L)  06/03/2013 4.1   Magnesium  (mg/dL)  Date Value  91/94/7975 2.0   Calcium (mg/dL)  Date Value  98/92/7974 8.8 (L)   Calcium, Total (mg/dL)  Date Value  89/86/7985 9.9   Albumin (g/dL)  Date Value  89/87/7975 4.3  10/06/2012 4.4   Sodium (mmol/L)  Date Value  08/29/2023 138  06/03/2013 138   Assessment: Ricardo Goodman is a 54 male who presented with chest pain, SVT and HR into the 170s. Troponins in 20s. EKG with normal sinus rhythm. Repeat ECHO planned. Patient will be placed on CIWA protocol. Pharmacy has been consulted to manage electrolytes.  Goal of Therapy:  Electrolytes within normal limits K = 2.8 Mg = pending  Phos = pending   Plan:  Give 40 mEq Kcl PO and 20 mEq IV  Re-check BMP, Mg, and Phos with AM labs   Ricardo Goodman, PharmD Pharmacy Resident  08/29/2023 4:37 PM

## 2023-08-29 NOTE — ED Triage Notes (Signed)
 Pt arrived via EMS from home d/t CP and SVT, EMS was able to convert pt back to sinus tachycardia by blowing in a straw. Pt CP started last night. Pt is only on BP medication

## 2023-08-30 DIAGNOSIS — F1010 Alcohol abuse, uncomplicated: Secondary | ICD-10-CM | POA: Diagnosis not present

## 2023-08-30 DIAGNOSIS — D649 Anemia, unspecified: Secondary | ICD-10-CM

## 2023-08-30 DIAGNOSIS — R079 Chest pain, unspecified: Secondary | ICD-10-CM | POA: Diagnosis not present

## 2023-08-30 LAB — URINE DRUG SCREEN, QUALITATIVE (ARMC ONLY)
Amphetamines, Ur Screen: NOT DETECTED
Barbiturates, Ur Screen: NOT DETECTED
Benzodiazepine, Ur Scrn: NOT DETECTED
Cannabinoid 50 Ng, Ur ~~LOC~~: NOT DETECTED
Cocaine Metabolite,Ur ~~LOC~~: NOT DETECTED
MDMA (Ecstasy)Ur Screen: NOT DETECTED
Methadone Scn, Ur: NOT DETECTED
Opiate, Ur Screen: NOT DETECTED
Phencyclidine (PCP) Ur S: NOT DETECTED
Tricyclic, Ur Screen: NOT DETECTED

## 2023-08-30 LAB — CBC
HCT: 27.1 % — ABNORMAL LOW (ref 39.0–52.0)
HCT: 25.1 % — ABNORMAL LOW (ref 39.0–52.0)
Hemoglobin: 8.5 g/dL — ABNORMAL LOW (ref 13.0–17.0)
Hemoglobin: 8.5 g/dL — ABNORMAL LOW (ref 13.0–17.0)
MCH: 32.2 pg (ref 26.0–34.0)
MCH: 32.7 pg (ref 26.0–34.0)
MCHC: 31.4 g/dL (ref 30.0–36.0)
MCHC: 33.9 g/dL (ref 30.0–36.0)
MCV: 102.7 fL — ABNORMAL HIGH (ref 80.0–100.0)
MCV: 96.5 fL (ref 80.0–100.0)
Platelets: 143 10*3/uL — ABNORMAL LOW (ref 150–400)
Platelets: 142 10*3/uL — ABNORMAL LOW (ref 150–400)
RBC: 2.64 MIL/uL — ABNORMAL LOW (ref 4.22–5.81)
RBC: 2.6 MIL/uL — ABNORMAL LOW (ref 4.22–5.81)
RDW: 13.4 % (ref 11.5–15.5)
RDW: 13.2 % (ref 11.5–15.5)
WBC: 6.2 10*3/uL (ref 4.0–10.5)
WBC: 5.8 10*3/uL (ref 4.0–10.5)
nRBC: 0 % (ref 0.0–0.2)
nRBC: 0 % (ref 0.0–0.2)

## 2023-08-30 LAB — COMPREHENSIVE METABOLIC PANEL
ALT: 46 U/L — ABNORMAL HIGH (ref 0–44)
AST: 77 U/L — ABNORMAL HIGH (ref 15–41)
Albumin: 3.7 g/dL (ref 3.5–5.0)
Alkaline Phosphatase: 63 U/L (ref 38–126)
Anion gap: 10 (ref 5–15)
BUN: 12 mg/dL (ref 6–20)
CO2: 22 mmol/L (ref 22–32)
Calcium: 8.9 mg/dL (ref 8.9–10.3)
Chloride: 106 mmol/L (ref 98–111)
Creatinine, Ser: 0.73 mg/dL (ref 0.61–1.24)
GFR, Estimated: >60 mL/min (ref >60)
Glucose, Bld: 94 mg/dL (ref 70–99)
Potassium: 3.4 mmol/L — ABNORMAL LOW (ref 3.5–5.1)
Sodium: 138 mmol/L (ref 135–145)
Total Bilirubin: 0.9 mg/dL (ref 0.0–1.2)
Total Protein: 6.1 g/dL — ABNORMAL LOW (ref 6.5–8.1)

## 2023-08-30 LAB — MAGNESIUM: Magnesium: 1.9 mg/dL (ref 1.7–2.4)

## 2023-08-30 LAB — PHOSPHORUS: Phosphorus: 2.9 mg/dL (ref 2.5–4.6)

## 2023-08-30 MED ORDER — PANTOPRAZOLE SODIUM 40 MG IV SOLR
40.0000 mg | Freq: Two times a day (BID) | INTRAVENOUS | Status: DC
Start: 2023-08-30 — End: 2023-08-31
  Administered 2023-08-30 – 2023-08-31 (×2): 40 mg via INTRAVENOUS
  Filled 2023-08-30 (×2): qty 10

## 2023-08-30 MED ORDER — NALTREXONE HCL 50 MG PO TABS
50.0000 mg | ORAL_TABLET | Freq: Every day | ORAL | Status: DC
  Administered 2023-08-30 – 2023-08-31 (×2): 50 mg via ORAL
  Filled 2023-08-30 (×2): qty 1

## 2023-08-30 MED ORDER — OXYCODONE-ACETAMINOPHEN 5-325 MG PO TABS
1.0000 | ORAL_TABLET | Freq: Once | ORAL | Status: AC
  Administered 2023-08-30: 1 via ORAL
  Filled 2023-08-30: qty 1

## 2023-08-30 MED ORDER — DICLOFENAC SODIUM 1 % EX GEL
2.0000 g | Freq: Four times a day (QID) | CUTANEOUS | Status: DC
  Administered 2023-08-30: 2 g via TOPICAL
  Filled 2023-08-30: qty 100

## 2023-08-30 MED ORDER — POTASSIUM CHLORIDE 20 MEQ PO PACK
40.0000 meq | PACK | Freq: Once | ORAL | Status: AC
  Administered 2023-08-30: 40 meq via ORAL
  Filled 2023-08-30: qty 2

## 2023-08-30 MED ORDER — ACETAMINOPHEN 325 MG PO TABS
650.0000 mg | ORAL_TABLET | Freq: Four times a day (QID) | ORAL | Status: DC
  Administered 2023-08-30 – 2023-08-31 (×4): 650 mg via ORAL
  Filled 2023-08-30 (×4): qty 2

## 2023-08-30 MED ORDER — POTASSIUM CHLORIDE 20 MEQ PO PACK: 20.0000 meq | PACK | Freq: Once | ORAL | Status: DC

## 2023-08-30 MED ORDER — PANTOPRAZOLE SODIUM 40 MG PO TBEC: 80.0000 mg | DELAYED_RELEASE_TABLET | Freq: Every day | ORAL | Status: DC

## 2023-08-30 MED ORDER — PANTOPRAZOLE SODIUM 40 MG PO TBEC
40.0000 mg | DELAYED_RELEASE_TABLET | Freq: Every day | ORAL | Status: DC
  Administered 2023-08-30: 40 mg via ORAL
  Filled 2023-08-30: qty 1

## 2023-08-30 MED ORDER — OXYCODONE-ACETAMINOPHEN 5-325 MG PO TABS: 1.0000 | ORAL_TABLET | Freq: Four times a day (QID) | ORAL | Status: DC | PRN

## 2023-08-30 NOTE — Consult Note (Addendum)
 PHARMACY CONSULT NOTE - FOLLOW UP  Pharmacy Consult for Electrolyte Monitoring and Replacement   Recent Labs: Potassium (mmol/L)  Date Value  08/30/2023 3.4 (L)  06/03/2013 4.1   Magnesium  (mg/dL)  Date Value  98/91/7974 1.9   Calcium (mg/dL)  Date Value  98/91/7974 8.9   Calcium, Total (mg/dL)  Date Value  89/86/7985 9.9   Albumin (g/dL)  Date Value  98/91/7974 3.7  10/06/2012 4.4   Phosphorus (mg/dL)  Date Value  98/91/7974 2.9   Sodium (mmol/L)  Date Value  08/30/2023 138  06/03/2013 138   Pertinent Medications: Fluids: NS with K 20mEq @100mL /hr  Assessment: Ricardo Goodman is a 69 male who presented with chest pain, SVT and HR into the 170s. Troponins in 20s. EKG with normal sinus rhythm. Repeat ECHO planned. Patient will be placed on CIWA protocol. Pharmacy has been consulted to manage electrolytes.   Goal of Therapy:  Electrolytes WNL 08/30/2023 K = 3.4 Mg = 1.9 Phos = 2.9  Plan:  Will give 40 mEq given patient presented with SVT and chest pain to target closer to 4.0 Patient already receiving 20 mEq in fluids started at 1700 1/7 to complete around 1700 1/8.  Mg and Phos do not need replacement at this time Will recheck BMP, Mg and Phos tomorrow with AM labs  Alfonso MARLA Buys, PharmD Pharmacy Resident  08/30/2023 7:39 AM

## 2023-08-30 NOTE — Progress Notes (Signed)
 Progress Note   Patient: Ricardo Goodman FMW:[id number] DOB: 12-Jul-1966 DOA: 08/29/2023     1 DOS: the patient was seen and examined on 08/30/2023   Brief hospital course: No notes on file  Assessment and Plan:  Acute on chronic anemia  Macrocytic anemia  Patient with noted significant decrease in Hgb since 05/2023 and additional 2 point Hgb drop since yesterday though this may be partially attributed to dilutional effect with receiving IV fluids.   Patient does note that for the past two days he has had dark colored (black) stools and has heavy alcohol history. Fortunately he is HDS.  -Continue folate supplementation -Check iron  panel, vitb12 -Check FOBT -Start PPI BID  -Continue to monitor H/H -Consult GI to determine if further evaluation necessary inpatient   Chest pain, likely MSK related  SVT Nonischemic myocardial injury secondary to SVT  Recurrent chest pain w/ noted episode of SVT w/ HR into 170s that resolved w/ valsalva maneuver w/ EMS  Chest discomfort is reproducible with palpation  EKG with t wave inversions in II, III, avF, V3-6 which appear to be new since 05/2023 ED visit, troponin in 20s and flat Fortunately during his August 2024 for similar issues he had coronary CT showing calcium score of 0, and patent coronary arteries, TTE 03/2023 with normal EF and no WMA  -Follow up repeat TTE    Alcohol use disorder  Elevated transaminases  Patient reports drinking at least 2 bears daily  Likely precipitated episode of SVT  EtOH level negative  Discussed medications to help with curb alcohol use, he would like to initiate this  -Start naltrexone  for alcohol use  -Continue CIWA protocol  -Continue to counsel patient on cessation   HTN (hypertension) BP stable  Titrate home regimen   Hypokalemia Replete         Subjective: Patient states that oxycodone  did relieve his chest discomfort and al though he continues to have some discomfort this AM it remains  improved.   Physical Exam: Vitals:   08/30/23 0430 08/30/23 0712 08/30/23 1122 08/30/23 1127  BP: (!) 132/93 120/83  (!) 148/82  Pulse: 79 65  78  Resp: (!) 8 20  18   Temp:  98.7 F (37.1 C) 98.3 F (36.8 C) (!) 97.3 F (36.3 C)  TempSrc:  Oral Oral Oral  SpO2: 97% 96%  97%  Weight:      Height:       Physical Exam  Constitutional: In no distress.  Cardiovascular: Normal rate, regular rhythm. No lower extremity edema  Chest wall: No erythema of overlying skin of L chest wall, just inferior to L breast under on rib cage, tender to mild palpation  Pulmonary: Non labored breathing on room air, no wheezing or rales.   Abdominal: Soft. Normal bowel sounds. Non distended and non tender Musculoskeletal: Normal range of motion.     Neurological: Alert and oriented to person, place, and time. Non focal  Skin: Skin is warm and dry.   Data Reviewed:  I have reviewed labs and images     Latest Ref Rng & Units 08/30/2023   10:55 AM 08/30/2023    6:22 AM 08/29/2023   12:30 PM  CBC  WBC 4.0 - 10.5 K/uL 5.8  6.2  11.0   Hemoglobin 13.0 - 17.0 g/dL 8.5  8.5  89.3   Hematocrit 39.0 - 52.0 % 25.1  27.1  31.6   Platelets 150 - 400 K/uL 142  143  199  Latest Ref Rng & Units 08/30/2023    6:22 AM 08/29/2023    9:33 PM 08/29/2023   12:30 PM  BMP  Glucose 70 - 99 mg/dL 94  887  867   BUN 6 - 20 mg/dL 12  14  11    Creatinine 0.61 - 1.24 mg/dL 9.26  9.14  8.90   Sodium 135 - 145 mmol/L 138  134  138   Potassium 3.5 - 5.1 mmol/L 3.4  3.3  2.8   Chloride 98 - 111 mmol/L 106  103  104   CO2 22 - 32 mmol/L 22  20  20    Calcium 8.9 - 10.3 mg/dL 8.9  9.1  8.8      Family Communication: Spouse at bedside   Disposition: Status is: Observation The patient will require care spanning > 2 midnights and should be moved to inpatient because: He will require further work up for his anemia   Planned Discharge Destination:  Pending clinical course     Time spent: 35 minutes  Author: Alban Pepper, MD 08/30/2023 2:38 PM  For on call review [email or web address].

## 2023-08-31 ENCOUNTER — Telehealth: Payer: Self-pay | Admitting: Gastroenterology

## 2023-08-31 DIAGNOSIS — R079 Chest pain, unspecified: Secondary | ICD-10-CM | POA: Diagnosis not present

## 2023-08-31 DIAGNOSIS — D649 Anemia, unspecified: Secondary | ICD-10-CM | POA: Diagnosis not present

## 2023-08-31 DIAGNOSIS — F1010 Alcohol abuse, uncomplicated: Secondary | ICD-10-CM | POA: Diagnosis not present

## 2023-08-31 LAB — CBC
HCT: 28.8 % — ABNORMAL LOW (ref 39.0–52.0)
Hemoglobin: 9.7 g/dL — ABNORMAL LOW (ref 13.0–17.0)
MCH: 32.3 pg (ref 26.0–34.0)
MCHC: 33.7 g/dL (ref 30.0–36.0)
MCV: 96 fL (ref 80.0–100.0)
Platelets: 172 10*3/uL (ref 150–400)
RBC: 3 MIL/uL — ABNORMAL LOW (ref 4.22–5.81)
RDW: 13 % (ref 11.5–15.5)
WBC: 6.6 10*3/uL (ref 4.0–10.5)
nRBC: 0 % (ref 0.0–0.2)

## 2023-08-31 LAB — HEPATIC FUNCTION PANEL
ALT: 62 U/L — ABNORMAL HIGH (ref 0–44)
AST: 84 U/L — ABNORMAL HIGH (ref 15–41)
Albumin: 4.2 g/dL (ref 3.5–5.0)
Alkaline Phosphatase: 78 U/L (ref 38–126)
Bilirubin, Direct: 0.2 mg/dL (ref 0.0–0.2)
Indirect Bilirubin: 0.5 mg/dL (ref 0.3–0.9)
Total Bilirubin: 0.7 mg/dL (ref 0.0–1.2)
Total Protein: 7.2 g/dL (ref 6.5–8.1)

## 2023-08-31 LAB — IRON AND TIBC
Iron: 42 ug/dL — ABNORMAL LOW (ref 45–182)
Saturation Ratios: 9 % — ABNORMAL LOW (ref 17.9–39.5)
TIBC: 456 ug/dL — ABNORMAL HIGH (ref 250–450)
UIBC: 414 ug/dL

## 2023-08-31 LAB — VITAMIN B12: Vitamin B-12: 449 pg/mL (ref 180–914)

## 2023-08-31 LAB — BASIC METABOLIC PANEL
Anion gap: 12 (ref 5–15)
BUN: 6 mg/dL (ref 6–20)
CO2: 21 mmol/L — ABNORMAL LOW (ref 22–32)
Calcium: 9.4 mg/dL (ref 8.9–10.3)
Chloride: 104 mmol/L (ref 98–111)
Creatinine, Ser: 0.73 mg/dL (ref 0.61–1.24)
GFR, Estimated: >60 mL/min (ref >60)
Glucose, Bld: 109 mg/dL — ABNORMAL HIGH (ref 70–99)
Potassium: 3.2 mmol/L — ABNORMAL LOW (ref 3.5–5.1)
Sodium: 137 mmol/L (ref 135–145)

## 2023-08-31 LAB — MAGNESIUM: Magnesium: 1.8 mg/dL (ref 1.7–2.4)

## 2023-08-31 LAB — FERRITIN: Ferritin: 66 ng/mL (ref 24–336)

## 2023-08-31 LAB — PHOSPHORUS: Phosphorus: 2.8 mg/dL (ref 2.5–4.6)

## 2023-08-31 MED ORDER — DICLOFENAC SODIUM 1 % EX GEL: 2.0000 g | Freq: Four times a day (QID) | CUTANEOUS | Status: DC

## 2023-08-31 MED ORDER — POTASSIUM CHLORIDE 20 MEQ PO PACK
40.0000 meq | PACK | ORAL | Status: AC
  Administered 2023-08-31 (×2): 40 meq via ORAL
  Filled 2023-08-31: qty 2

## 2023-08-31 MED ORDER — PANTOPRAZOLE SODIUM 40 MG PO TBEC: 40.0000 mg | DELAYED_RELEASE_TABLET | Freq: Two times a day (BID) | ORAL | 0 refills | Status: DC

## 2023-08-31 MED ORDER — DICLOFENAC SODIUM 1 % EX GEL: 2.0000 g | Freq: Four times a day (QID) | CUTANEOUS | Status: AC

## 2023-08-31 MED ORDER — NALTREXONE HCL 50 MG PO TABS: 50.0000 mg | ORAL_TABLET | Freq: Every day | ORAL | 2 refills | Status: DC

## 2023-08-31 MED ORDER — IRON SUCROSE 200 MG IVPB - SIMPLE MED
200.0000 mg | Freq: Once | Status: AC
Start: 2023-08-31 — End: 2023-08-31
  Administered 2023-08-31: 200 mg via INTRAVENOUS
  Filled 2023-08-31: qty 200

## 2023-08-31 MED ORDER — PANTOPRAZOLE SODIUM 40 MG PO TBEC
40.0000 mg | DELAYED_RELEASE_TABLET | Freq: Two times a day (BID) | ORAL | 0 refills | Status: DC
Start: 2023-08-31 — End: 2023-08-31

## 2023-08-31 MED ORDER — NALTREXONE HCL 50 MG PO TABS: 50.0000 mg | ORAL_TABLET | Freq: Every day | ORAL | 2 refills | Status: AC

## 2023-08-31 NOTE — Discharge Instructions (Addendum)
 Please make sure to follow up with the gastroenterology doctors regarding your low blood count and dark stools.   Please continue with twice daily medication protonix .   If you notice increased bleeding or get lightheaded please come to the emergency room.     Intensive Outpatient Programs   [facility] [address] [place],  [place], [place] [phone]  [facility] (Inpatient and outpatient)[phone] (Suboxone and Methadone) [address] [phone]  [facility] - Intensive Outpatient [address] [place], [place] [address]  [phone]  [facility] (Outpatient, Inpatient, [facility] (Groups and Residental) (insurance only) [phone] [place], [place] [phone]   [facility] (for caregivers and family) [address] [place] [address], [place], [place] [phone]  Residential Treatment Programs  [facility](2 years) Residential: 62 days)[facility] [address] [place], [place], [place] [phone] or [phone]  [facility] [address] [place], [place], [place] [phone]  [facility] ([facility] [address] [place], [place], [place] [phone] Admissions: 8am-3pm M-F  [facility]: Residential Program [phone] Days)             [facility]: [place]  [facility]  [place], [place], [place]  [phone] or [phone] in Hours over the weekend or by  referral)  [facility] [address] [place], [place] [address] [phone] (Do virtual or phone assessment, offer transportation within 25 miles, have in patient and Outpatient options)   [facility] Crisis: [facility]:[phone] (for crisis response 24 hours a day)

## 2023-08-31 NOTE — Telephone Encounter (Signed)
 Patient called in to schedule an follow up from the ED visit.

## 2023-08-31 NOTE — Consult Note (Signed)
 PHARMACY CONSULT NOTE - FOLLOW UP  Pharmacy Consult for Electrolyte Monitoring and Replacement   Recent Labs: Potassium (mmol/L)  Date Value  08/31/2023 3.2 (L)  06/03/2013 4.1   Magnesium  (mg/dL)  Date Value  98/90/7974 1.8   Calcium (mg/dL)  Date Value  98/90/7974 9.4   Calcium, Total (mg/dL)  Date Value  89/86/7985 9.9   Albumin (g/dL)  Date Value  98/90/7974 4.2  10/06/2012 4.4   Phosphorus (mg/dL)  Date Value  98/90/7974 2.8   Sodium (mmol/L)  Date Value  08/31/2023 137  06/03/2013 138   Pertinent Medications: Fluids: NS with K 20mEq @100mL /hr  Assessment: JM is a 61 male who presented with chest pain, SVT and HR into the 170s. Troponins in 20s. EKG with normal sinus rhythm. Repeat ECHO planned. Patient will be placed on CIWA protocol. Pharmacy has been consulted to manage electrolytes.   Goal of Therapy:  Electrolytes WNL 08/31/2023 K = 3.2 Mg = 1.8 Phos = 2.8  Plan:  Will give 40 mEq x 2 Mg and Phos do not need replacement at this time Will recheck BMP, Mg and Phos tomorrow with AM labs  Mehdi Gironda A Amanda Pote, PharmD Clinical Pharmacist 08/31/2023 7:20 AM

## 2023-08-31 NOTE — TOC Transition Note (Signed)
 Transition of Care [facility]) - Discharge Note   Patient Details  Name: Ricardo Goodman MRN: [id number] Date of Birth: February 16, 1966  [facility] of [facility]) CM/SW Contact:  Lauraine JAYSON Carpen, LCSW Phone Number: 08/31/2023, 1:11 PM   Clinical Narrative:   Patient has orders to discharge home today. CSW met with patient. No supports at bedside. CSW introduced role and inquired about interest in SA resources. Patient is agreeable. CSW added to his AVS. No further concerns. He confirmed he has a ride home today. CSW signing off.  Final next level of care: Home/Self Care Barriers to Discharge: Barriers Resolved   Patient Goals and CMS Choice            Discharge Placement                    Patient and family notified of of transfer: 08/31/23  Discharge Plan and Services Additional resources added to the After Visit Summary for                                       Social Drivers of Health (SDOH) Interventions SDOH Screenings   Food Insecurity: No Food Insecurity (03/27/2023)  Housing: Low Risk  (03/27/2023)  Transportation Needs: No Transportation Needs (03/27/2023)  Utilities: Not At Risk (03/27/2023)  Tobacco Use: Low Risk  (08/29/2023)     Readmission Risk Interventions     No data to display

## 2023-09-02 NOTE — Discharge Summary (Signed)
 Physician Discharge Summary   Patient: Ricardo Goodman MRN: [id number] DOB: 1966/05/30  Admit date:     08/29/2023  Discharge date: 08/31/2023  Discharge Physician: Alban Pepper   [facility]   Recommendations at discharge:    Repeat CBC to ensure stable    Discharge Diagnoses: Principal Problem:   Chest pain Active Problems:   Alcohol abuse   HTN (hypertension)   Hypokalemia   Acute on chronic anemia  Resolved Problems:   * No resolved hospital problems. *  Hospital Course:  Ricardo Goodman is a 58 y.o. male with medical history significant of alcohol use disorder, hypertension, tobacco use disorder presenting with chest pain, SVT.    Patient reports episodes of chest pain on the day of presentation to the ED. The chest pain was central, non radiating, sometimes worse with movement and deep breathing.  He had no dyspnea or N/V.  Patient previously admitted in 03/2023 for similar presentation related to chest pain.  Had formal evaluation with cardiology.  CT coronary with a calcium score of 0.  Continues  to drink at least 2 beers daily.  Denies any illicit drug use.  Still smoking cigarettes.    Patient was noted to be in SVT with HR in the 170s at PCP office. Valsalva maneuver successful with cessation of Svt with EMS.   Presented to the ER afebrile, heart rate low 100s, SBP 70s improved to 100s systolic with IV fluids.  Satting well on room air.  White count 11, hemoglobin 10.6, platelets 199, creatinine 1.09, potassium 2.8.  Troponin 29.  EKG, sinus rhythm.  Patient's troponins elevated to 50 but 2nd and 3rd were flat. Patient noted that chest discomfort was reproducible by pressing on his left chest. He noted that opioids and topical analgesics helped resolve his pain. Given his recent cardiac evaluation with calcium score of 0 and resolution of his symptoms with topical analgesics further cardiac evaluation was deferred.   Patient noted to have 2  point hgb drop compared to labs from 05/2023. On HD #2 his hgb dropped an additional 2 points. He was HDS at this time. Patient also noted that he had had 2 dark stools over the past couple of days as well. No dark stools were noted on rectal exam. Patient was noted to have an iron  deficiency. Discussed patient's case with GI and they recommended trending his hgb and if stable deferring endoscopy to outpatient. Fortunately, patient's hgb improved on the day of discharge. He was able to schedule follow up with GI for 1 week from discharge.   Assessment and Plan: * Chest pain Recurrent chest pain w/ noted episode of SVT w/ HR into 170s- resolved s/p valsalva maneuver w/ EMS. Noted pleuritic component. Chest pain also reproduced with palpation. Troponin trended flat. EKG w/ NSR some T wave abnormalities noted, in inferior leads and V3-6 (similar to EKG 05/2023). Patient did have extensive cardiac work up in 03/2023 when he presented for similar complaints. CT coronary at that time with calcium score of 0. TTE 03/2023 with normal EF, no WMA, and no valvular pathology. No further cardiac work up was pursued and patients chest discomfort improved with topical analgesic.   Acute non ischemic myocardial injury 2/2 SVT Patient with troponin 29>44>50. Essentially trended flat. He did have SVT prior to arrival which was likely secondary to his heavy alcohol use. Patient's chest discomfort did resolve by the time of discharge.   Acute on chronic anemia  Iron   deficiency anemia  Macrocytic anemia  Patient with noted significant decrease in Hgb since 05/2023 and additional 2 point Hgb drop during hospitalization that could not be explained by dilutional effect of IV fluids solely.   Patient did note that for the past two days since admission he has had dark colored (black) stools. He also has heavy alcohol history as noted below. Fortunately after admission, he remained HDS.  His iron  panel showed that he is iron   deficient. He received 1x dose of IV iron  (venofer ) 200mg .    Case was discussed with GI and because patient remained HDS and his blood count improved on the day of discharge, it was recommended he undergo further evaluation outpatient.   Patient was discharged on PPI BID and scheduled appointment with GI prior to leaving.    Alcohol use disorder  Elevated transaminases  Patient reports drinking 2+ beers daily at minimum. Sometimes heavier drinking on weekends. HE is also noted to have transaminitis on labs. Discussed with patient the importance of cessation. He would like to try a medication for this. He does not use any opioids regularly.  He was started on naltrexone  prior to discharge.    HTN (hypertension) BP stable  Home medications were resumed on discharge.    Hypokalemia Repleted.         Pain control - [place]  Controlled Substance Reporting System database was reviewed. and patient was instructed, not to drive, operate heavy machinery, perform activities at heights, swimming or participation in water activities or provide baby-sitting services while on Pain, Sleep and Anxiety Medications; until their outpatient Physician has advised to do so again. Also recommended to not to take more than prescribed Pain, Sleep and Anxiety Medications.  Consultants: None Procedures performed: None  Disposition: Home Diet recommendation:  Discharge Diet Orders (From admission, onward)     Start     Ordered   08/31/23 0000  Diet - low sodium heart healthy        08/31/23 1237           Regular diet DISCHARGE MEDICATION: Allergies as of 08/31/2023   No Known Allergies      Medication List     STOP taking these medications    cyclobenzaprine  5 MG tablet Commonly known as: Flexeril    gabapentin  300 MG capsule Commonly known as: NEURONTIN    hydrocortisone  25 MG suppository Commonly known as: ANUSOL -HC   omeprazole  20 MG capsule Commonly known as: PRILOSEC        TAKE these medications    albuterol  108 (90 Base) MCG/ACT inhaler Commonly known as: VENTOLIN  HFA Inhale 2 puffs into the lungs every 4 (four) hours as needed for wheezing or shortness of breath.   amLODipine  5 MG tablet Commonly known as: NORVASC  Take 1 tablet (5 mg total) by mouth daily.   diclofenac  Sodium 1 % Gel Commonly known as: VOLTAREN  Apply 2 g topically 4 (four) times daily.   folic acid  1 MG tablet Commonly known as: FOLVITE  Take 1 tablet (1 mg total) by mouth daily.   multivitamin with minerals Tabs tablet Take 1 tablet by mouth daily.   naltrexone  50 MG tablet Commonly known as: DEPADE Take 1 tablet (50 mg total) by mouth daily.   pantoprazole  40 MG tablet Commonly known as: Protonix  Take 1 tablet (40 mg total) by mouth 2 (two) times daily for 15 days.   thiamine  100 MG tablet Commonly known as: Vitamin B-1 Take 1 tablet (100 mg total) by mouth daily.  Discharge Exam: Filed Weights   08/29/23 1225  Weight: 86.2 kg   Physical Exam  Constitutional: In no distress.  Cardiovascular: Normal rate, regular rhythm. No lower extremity edema  Pulmonary: Non labored breathing on room air, no wheezing or rales.   Abdominal: Soft. Normal bowel sounds. Non distended and non tender Musculoskeletal: Normal range of motion.     Neurological: Alert and oriented to person, place, and time. Non focal  Skin: Skin is warm and dry.    Condition at discharge: stable  The results of significant diagnostics from this hospitalization (including imaging, microbiology, ancillary and laboratory) are listed below for reference.   Imaging Studies: DG Chest 2 View Result Date: 08/29/2023 CLINICAL DATA:  Chest pain, shortness of breath. EXAM: CHEST - 2 VIEW COMPARISON:  June 03, 2023. FINDINGS: The heart size and mediastinal contours are within normal limits. Both lungs are clear. The visualized skeletal structures are unremarkable. IMPRESSION: No active  cardiopulmonary disease. Electronically Signed   By: Lynwood Landy Raddle M.D.   On: 08/29/2023 13:13    Microbiology: No results found for this or any previous visit.  Labs: CBC: Recent Labs  Lab 08/29/23 1230 08/30/23 0622 08/30/23 1055 08/31/23 0427  WBC 11.0* 6.2 5.8 6.6  HGB 10.6* 8.5* 8.5* 9.7*  HCT 31.6* 27.1* 25.1* 28.8*  MCV 96.6 102.7* 96.5 96.0  PLT 199 143* 142* 172   Basic Metabolic Panel: Recent Labs  Lab 08/29/23 1230 08/29/23 1714 08/29/23 2133 08/30/23 0622 08/31/23 0427  NA 138  --  134* 138 137  K 2.8*  --  3.3* 3.4* 3.2*  CL 104  --  103 106 104  CO2 20*  --  20* 22 21*  GLUCOSE 132*  --  112* 94 109*  BUN 11  --  14 12 6   CREATININE 1.09  --  0.85 0.73 0.73  CALCIUM 8.8*  --  9.1 8.9 9.4  MG  --  1.6*  --  1.9 1.8  PHOS  --  3.4  --  2.9 2.8   Liver Function Tests: Recent Labs  Lab 08/30/23 0622 08/31/23 0427  AST 77* 84*  ALT 46* 62*  ALKPHOS 63 78  BILITOT 0.9 0.7  PROT 6.1* 7.2  ALBUMIN 3.7 4.2   CBG: No results for input(s): GLUCAP in the last 168 hours.  Discharge time spent: greater than 30 minutes.  Signed: Alban Pepper, MD [facility] 09/02/2023

## 2023-09-07 ENCOUNTER — Ambulatory Visit: Payer: 59 | Admitting: Gastroenterology

## 2023-09-07 NOTE — Progress Notes (Deleted)
Wyline Mood MD, MRCP(U.K) [address]  [address]  [place], [place] [address]  Main: [phone]  Fax: [phone]   Primary Care Physician: [facility]  Primary Gastroenterologist:  Dr. Wyline Mood   No chief complaint on file.   HPI: Ricardo Goodman is a 58 y.o. male   Summary of history :  Previously seen back in 05/12/2023 for banding of internal hemorrhoids that had failed conservative management.  Had left lateral column banded did not follow-up sequently.  Previously seen by Dr. Servando Snare in 04/11/2023 for chest pain history of alcohol abuse.  History of rectal bleeding hepatic steatosis colonoscopy showed only internal hemorrhoids.  Seen at the ER on 08/29/2023 for chest discomfort seen by his PCP found to be in SVT.  He was hypotensive admitted to the hospital.  It was attributed to be due to pain no cardiac evaluation was performed.  Noted to have a 2 g drop in hemoglobin, found to have dark stools iron deficiency and was recommended outpatient evaluation. 08/31/2023 hemoglobin 9.7 g MCV of 96 was 12.8 g 3 months back.  Hepatic function panel showed AST of 84 ALT of 62 ferritin of 66 B12 normal.  Creatinine 0.73.  Interval history   ***/***/2023-  ***/***/2023   Current Outpatient Medications  Medication Sig Dispense Refill   albuterol (VENTOLIN HFA) 108 (90 Base) MCG/ACT inhaler Inhale 2 puffs into the lungs every 4 (four) hours as needed for wheezing or shortness of breath. 18 g 0   amLODipine (NORVASC) 5 MG tablet Take 1 tablet (5 mg total) by mouth daily. 30 tablet 2   diclofenac Sodium (VOLTAREN) 1 % GEL Apply 2 g topically 4 (four) times daily.     folic acid (FOLVITE) 1 MG tablet Take 1 tablet (1 mg total) by mouth daily. 90 tablet 0   Multiple Vitamin (MULTIVITAMIN WITH MINERALS) TABS tablet Take 1 tablet by mouth daily. (Patient not taking: Reported on 08/29/2023) 90 tablet 0   naltrexone (DEPADE) 50 MG tablet Take 1 tablet (50 mg total) by mouth  daily. 30 tablet 2   pantoprazole (PROTONIX) 40 MG tablet Take 1 tablet (40 mg total) by mouth 2 (two) times daily for 15 days. 30 tablet 0   thiamine (VITAMIN B-1) 100 MG tablet Take 1 tablet (100 mg total) by mouth daily. 90 tablet 0   No current facility-administered medications for this visit.    Allergies as of 09/07/2023   (No Known Allergies)       Interval history   ***/***/202*   ***/***/2024   ROS:  General: Negative for anorexia, weight loss, fever, chills, fatigue, weakness. ENT: Negative for hoarseness, difficulty swallowing , nasal congestion. CV: Negative for chest pain, angina, palpitations, dyspnea on exertion, peripheral edema.  Respiratory: Negative for dyspnea at rest, dyspnea on exertion, cough, sputum, wheezing.  GI: See history of present illness. GU:  Negative for dysuria, hematuria, urinary incontinence, urinary frequency, nocturnal urination.  Endo: Negative for unusual weight change.    Physical Examination:   There were no vitals taken for this visit.  General: Well-nourished, well-developed in no acute distress.  Eyes: No icterus. Conjunctivae pink. Mouth: Oropharyngeal mucosa moist and pink , no lesions erythema or exudate. Lungs: Clear to auscultation bilaterally. Non-labored. Heart: Regular rate and rhythm, no murmurs rubs or gallops.  Abdomen: Bowel sounds are normal, nontender, nondistended, no hepatosplenomegaly or masses, no abdominal bruits or hernia , no rebound or guarding.   Extremities: No lower extremity  edema. No clubbing or deformities. Neuro: Alert and oriented x 3.  Grossly intact. Skin: Warm and dry, no jaundice.   Psych: Alert and cooperative, normal mood and affect.   Imaging Studies: DG Chest 2 View Result Date: 08/29/2023 CLINICAL DATA:  Chest pain, shortness of breath. EXAM: CHEST - 2 VIEW COMPARISON:  June 03, 2023. FINDINGS: The heart size and mediastinal contours are within normal limits. Both lungs are clear. The  visualized skeletal structures are unremarkable. IMPRESSION: No active cardiopulmonary disease. Electronically Signed   By: Lupita Raider M.D.   On: 08/29/2023 13:13    Assessment and Plan:   Ricardo Goodman is a 58 y.o. y/o male ***    Dr Wyline Mood  MD,MRCP (U.K) Follow up in ***  BP check ***

## 2023-11-23 ENCOUNTER — Emergency Department

## 2023-11-23 ENCOUNTER — Other Ambulatory Visit: Payer: Self-pay

## 2023-11-23 DIAGNOSIS — R062 Wheezing: Secondary | ICD-10-CM | POA: Diagnosis not present

## 2023-11-23 DIAGNOSIS — R079 Chest pain, unspecified: Secondary | ICD-10-CM | POA: Diagnosis present

## 2023-11-23 LAB — CBC
HCT: 32.6 % — ABNORMAL LOW (ref 39.0–52.0)
Hemoglobin: 10.9 g/dL — ABNORMAL LOW (ref 13.0–17.0)
MCH: 30.5 pg (ref 26.0–34.0)
MCHC: 33.4 g/dL (ref 30.0–36.0)
MCV: 91.3 fL (ref 80.0–100.0)
Platelets: 230 10*3/uL (ref 150–400)
RBC: 3.57 MIL/uL — ABNORMAL LOW (ref 4.22–5.81)
RDW: 13.9 % (ref 11.5–15.5)
WBC: 5.3 10*3/uL (ref 4.0–10.5)
nRBC: 0 % (ref 0.0–0.2)

## 2023-11-23 LAB — BASIC METABOLIC PANEL WITH GFR
Anion gap: 15 (ref 5–15)
BUN: 7 mg/dL (ref 6–20)
CO2: 23 mmol/L (ref 22–32)
Calcium: 10.1 mg/dL (ref 8.9–10.3)
Chloride: 103 mmol/L (ref 98–111)
Creatinine, Ser: 0.76 mg/dL (ref 0.61–1.24)
GFR, Estimated: >60 mL/min (ref >60)
Glucose, Bld: 101 mg/dL — ABNORMAL HIGH (ref 70–99)
Potassium: 3 mmol/L — ABNORMAL LOW (ref 3.5–5.1)
Sodium: 141 mmol/L (ref 135–145)

## 2023-11-23 LAB — TROPONIN I (HIGH SENSITIVITY): Troponin I (High Sensitivity): 9 ng/L (ref <18)

## 2023-11-23 NOTE — ED Triage Notes (Signed)
 Pt reports left side chest pain that began yesterday, pt states the pain is a heaviness. Pt denies known cardiac hx but states he has had palpitations in the past.

## 2023-11-24 ENCOUNTER — Emergency Department
Admission: EM | Admit: 2023-11-24 | Discharge: 2023-11-24 | Disposition: A | Discharge status: Home / Self Care | Attending: Emergency Medicine | Admitting: Emergency Medicine

## 2023-11-24 DIAGNOSIS — R062 Wheezing: Secondary | ICD-10-CM

## 2023-11-24 DIAGNOSIS — R079 Chest pain, unspecified: Secondary | ICD-10-CM

## 2023-11-24 LAB — TROPONIN I (HIGH SENSITIVITY): Troponin I (High Sensitivity): 8 ng/L (ref <18)

## 2023-11-24 MED ORDER — ALBUTEROL SULFATE (2.5 MG/3ML) 0.083% IN NEBU
2.5000 mg | INHALATION_SOLUTION | RESPIRATORY_TRACT | 2 refills | Status: AC | PRN
Start: 2023-11-24 — End: 2024-11-23

## 2023-11-24 MED ORDER — COMPRESSOR/NEBULIZER MISC: 1.0000 [IU] | Freq: Once | 0 refills | Status: AC

## 2023-11-24 MED ORDER — IPRATROPIUM-ALBUTEROL 0.5-2.5 (3) MG/3ML IN SOLN
3.0000 mL | Freq: Once | RESPIRATORY_TRACT | Status: AC
  Administered 2023-11-24: 3 mL via RESPIRATORY_TRACT
  Filled 2023-11-24: qty 3

## 2023-11-24 MED ORDER — AEROCHAMBER MV MISC: 0 refills | Status: AC

## 2023-11-24 NOTE — ED Provider Notes (Signed)
 [facility] Provider Note   Event Date/Time   First MD Initiated Contact with Patient 11/24/23 [phone]     (approximate) History  Chest Pain  HPI Ricardo Goodman is a 58 y.o. male with a stated past medical history of paroxysmal SVT and recent coronary CT with a calcium score of 0 this last year who presents complaining of chest pain with associated mild shortness of breath.  Patient has stated past medical history of asthma as well and has tried to take his inhaler with no improvement of his chest pain or shortness of breath.  Patient's family member at bedside also states that he had an episode of presyncope today with the symptoms.  Patient does endorse intermittent palpitations but they have not been sustained.  Patient currently complains of 9/10 chest tightness  ROS: Patient currently denies any vision changes, tinnitus, difficulty speaking, facial droop, sore throat, chest pain, shortness of breath, abdominal pain, nausea/vomiting/diarrhea, dysuria, or weakness/numbness/paresthesias in any extremity   Physical Exam  Triage Vital Signs: ED Triage Vitals  Encounter Vitals Group     BP 11/23/23 2143 (!) 115/96     Systolic BP Percentile --      Diastolic BP Percentile --      Pulse Rate 11/23/23 2143 87     Resp 11/23/23 2143 18     Temp 11/23/23 2143 98.4 F (36.9 C)     Temp Source 11/23/23 2143 Oral     SpO2 11/23/23 2143 100 %     Weight 11/23/23 2142 180 lb (81.6 kg)     Height 11/23/23 2142 5\' 8"  (1.727 m)     Head Circumference --      Peak Flow --      Pain Score 11/23/23 2142 9     Pain Loc --      Pain Education --      Exclude from Growth Chart --    Most recent vital signs: Vitals:   11/23/23 2143 11/24/23 0224  BP: (!) 115/96 (!) 141/88  Pulse: 87 70  Resp: 18 12  Temp: 98.4 F (36.9 C) 97.9 F (36.6 C)  SpO2: 100% 100%   General: Awake, oriented x4. CV:  Good peripheral perfusion.  Resp:  Normal effort.  End expiratory wheezing  over bilateral lung fields Abd:  No distention.  Other:  Middle-aged overweight African-American male resting comfortably in no acute distress ED Results / Procedures / Treatments  Labs (all labs ordered are listed, but only abnormal results are displayed) Labs Reviewed  BASIC METABOLIC PANEL WITH GFR - Abnormal; Notable for the following components:      Result Value   Potassium 3.0 (*)    Glucose, Bld 101 (*)    All other components within normal limits  CBC - Abnormal; Notable for the following components:   RBC 3.57 (*)    Hemoglobin 10.9 (*)    HCT 32.6 (*)    All other components within normal limits  TROPONIN I (HIGH SENSITIVITY)  TROPONIN I (HIGH SENSITIVITY)   EKG ED ECG REPORT I, Merwyn Katos, the attending physician, personally viewed and interpreted this ECG. Date: 11/24/2023 EKG Time: 2147 Rate: 78 Rhythm: normal sinus rhythm QRS Axis: normal Intervals: normal ST/T Wave abnormalities: normal Narrative Interpretation: no evidence of acute ischemia RADIOLOGY ED MD interpretation: 2 view chest x-ray interpreted by me shows no evidence of acute abnormalities including no pneumonia, pneumothorax, or widened mediastinum -Agree with radiology assessment Official radiology report(s): DG  Chest 2 View Result Date: 11/23/2023 CLINICAL DATA:  Left chest pain onset yesterday. EXAM: CHEST - 2 VIEW COMPARISON:  AP and lateral chest 08/29/23. FINDINGS: The heart size and mediastinal contours are within normal limits. Both lungs are clear. The visualized skeletal structures are unremarkable apart from mild chronic lower thoracic levoscoliosis. IMPRESSION: No evidence of acute chest disease or interval changes. Electronically Signed   By: Almira Bar M.D.   On: 11/23/2023 22:15   PROCEDURES: Critical Care performed: No .1-3 Lead EKG Interpretation  Performed by: Merwyn Katos, MD Authorized by: Merwyn Katos, MD     Interpretation: normal     ECG rate:  71   ECG rate  assessment: normal     Rhythm: sinus rhythm     Ectopy: none     Conduction: normal    MEDICATIONS ORDERED IN ED: Medications  ipratropium-albuterol (DUONEB) 0.5-2.5 (3) MG/3ML nebulizer solution 3 mL (3 mLs Nebulization Given 11/24/23 0224)   IMPRESSION / MDM / ASSESSMENT AND PLAN / ED COURSE  I reviewed the triage vital signs and the nursing notes.                             The patient is on the cardiac monitor to evaluate for evidence of arrhythmia and/or significant heart rate changes. Patient's presentation is most consistent with acute presentation with potential threat to life or bodily function. Workup: ECG, CXR, CBC, BMP, Troponin Findings: ECG: No overt evidence of STEMI. No evidence of Brugada's sign, delta wave, epsilon wave, significantly prolonged QTc, or malignant arrhythmia HS Troponin: Negative x1 Other Labs unremarkable for emergent problems. CXR: Without PTX, PNA, or widened mediastinum HEART Score: 3  Given History, Exam, and Workup I have low suspicion for ACS, Pneumothorax, Pneumonia, Pulmonary Embolus, Tamponade, Aortic Dissection or other emergent problem as a cause for this presentation.   Reassesment: Prior to discharge patient's pain was controlled and they were well appearing.  Patient symptoms improved with nebulizer treatment and concern for possible asthma/COPD/reactive airway disease as a cause for patient's chest pain.  Patient provided with AeroChamber as well as nebulizer for home use  Disposition:  Discharge. Strict return precautions discussed with patient with full understanding. Advised patient to follow up promptly with primary care provider    FINAL CLINICAL IMPRESSION(S) / ED DIAGNOSES   Final diagnoses:  Chest pain, unspecified type  Wheezing on auscultation   Rx / DC Orders   ED Discharge Orders          Ordered    Spacer/Aero-Holding Chambers (AEROCHAMBER MV) inhaler        11/24/23 0320    Nebulizers (COMPRESSOR/NEBULIZER) MISC    Once        11/24/23 0320    albuterol (PROVENTIL) (2.5 MG/3ML) 0.083% nebulizer solution  Every 4 hours PRN        11/24/23 0320           Note:  This document was prepared using Dragon voice recognition software and may include unintentional dictation errors.   Merwyn Katos, MD 11/24/23 [phone]

## 2023-11-29 ENCOUNTER — Other Ambulatory Visit: Payer: Self-pay

## 2023-11-29 ENCOUNTER — Emergency Department

## 2023-11-29 ENCOUNTER — Emergency Department
Admission: EM | Admit: 2023-11-29 | Discharge: 2023-11-29 | Disposition: A | Discharge status: Home / Self Care | Attending: Emergency Medicine | Admitting: Emergency Medicine

## 2023-11-29 DIAGNOSIS — F1090 Alcohol use, unspecified, uncomplicated: Secondary | ICD-10-CM | POA: Insufficient documentation

## 2023-11-29 DIAGNOSIS — R079 Chest pain, unspecified: Secondary | ICD-10-CM | POA: Diagnosis present

## 2023-11-29 DIAGNOSIS — R1013 Epigastric pain: Secondary | ICD-10-CM | POA: Diagnosis not present

## 2023-11-29 LAB — COMPREHENSIVE METABOLIC PANEL WITH GFR
ALT: 53 U/L — ABNORMAL HIGH (ref 0–44)
AST: 114 U/L — ABNORMAL HIGH (ref 15–41)
Albumin: 4.6 g/dL (ref 3.5–5.0)
Alkaline Phosphatase: 69 U/L (ref 38–126)
Anion gap: 14 (ref 5–15)
BUN: 10 mg/dL (ref 6–20)
CO2: 22 mmol/L (ref 22–32)
Calcium: 9.5 mg/dL (ref 8.9–10.3)
Chloride: 106 mmol/L (ref 98–111)
Creatinine, Ser: 0.73 mg/dL (ref 0.61–1.24)
GFR, Estimated: >60 mL/min (ref >60)
Glucose, Bld: 86 mg/dL (ref 70–99)
Potassium: 3.4 mmol/L — ABNORMAL LOW (ref 3.5–5.1)
Sodium: 142 mmol/L (ref 135–145)
Total Bilirubin: 0.9 mg/dL (ref 0.0–1.2)
Total Protein: 7.7 g/dL (ref 6.5–8.1)

## 2023-11-29 LAB — CBC WITH DIFFERENTIAL/PLATELET
Abs Immature Granulocytes: 0.02 10*3/uL (ref 0.00–0.07)
Basophils Absolute: 0.1 10*3/uL (ref 0.0–0.1)
Basophils Relative: 1 %
Eosinophils Absolute: 0.1 10*3/uL (ref 0.0–0.5)
Eosinophils Relative: 1 %
HCT: 30.6 % — ABNORMAL LOW (ref 39.0–52.0)
Hemoglobin: 10 g/dL — ABNORMAL LOW (ref 13.0–17.0)
Immature Granulocytes: 0 %
Lymphocytes Relative: 25 %
Lymphs Abs: 1.7 10*3/uL (ref 0.7–4.0)
MCH: 29.9 pg (ref 26.0–34.0)
MCHC: 32.7 g/dL (ref 30.0–36.0)
MCV: 91.3 fL (ref 80.0–100.0)
Monocytes Absolute: 0.9 10*3/uL (ref 0.1–1.0)
Monocytes Relative: 13 %
Neutro Abs: 4.1 10*3/uL (ref 1.7–7.7)
Neutrophils Relative %: 60 %
Platelets: 208 10*3/uL (ref 150–400)
RBC: 3.35 MIL/uL — ABNORMAL LOW (ref 4.22–5.81)
RDW: 14.5 % (ref 11.5–15.5)
WBC: 7 10*3/uL (ref 4.0–10.5)
nRBC: 0 % (ref 0.0–0.2)

## 2023-11-29 LAB — TROPONIN I (HIGH SENSITIVITY)
Troponin I (High Sensitivity): 17 ng/L (ref <18)
Troponin I (High Sensitivity): 15 ng/L (ref <18)

## 2023-11-29 LAB — LIPASE, BLOOD: Lipase: 32 U/L (ref 11–51)

## 2023-11-29 LAB — ETHANOL: Alcohol, Ethyl (B): 239 mg/dL — ABNORMAL HIGH (ref <10)

## 2023-11-29 MED ORDER — LIDOCAINE VISCOUS HCL 2 % MT SOLN
15.0000 mL | Freq: Once | OROMUCOSAL | Status: AC
  Administered 2023-11-29: 15 mL via ORAL
  Filled 2023-11-29: qty 15

## 2023-11-29 MED ORDER — MORPHINE SULFATE (PF) 4 MG/ML IV SOLN
4.0000 mg | Freq: Once | INTRAVENOUS | Status: AC
  Administered 2023-11-29: 4 mg via INTRAVENOUS
  Filled 2023-11-29: qty 1

## 2023-11-29 MED ORDER — FAMOTIDINE 20 MG PO TABS
20.0000 mg | ORAL_TABLET | Freq: Once | ORAL | Status: AC
  Administered 2023-11-29: 20 mg via ORAL
  Filled 2023-11-29: qty 1

## 2023-11-29 MED ORDER — MORPHINE SULFATE (PF) 2 MG/ML IV SOLN
2.0000 mg | Freq: Once | INTRAVENOUS | Status: AC
  Administered 2023-11-29: 2 mg via INTRAVENOUS
  Filled 2023-11-29: qty 1

## 2023-11-29 MED ORDER — ACETAMINOPHEN 325 MG PO TABS
650.0000 mg | ORAL_TABLET | Freq: Once | ORAL | Status: AC
  Administered 2023-11-29: 650 mg via ORAL
  Filled 2023-11-29: qty 2

## 2023-11-29 MED ORDER — ALUM & MAG HYDROXIDE-SIMETH 200-200-20 MG/5ML PO SUSP
30.0000 mL | Freq: Once | ORAL | Status: AC
Start: 2023-11-29 — End: 2023-11-29
  Administered 2023-11-29: 30 mL via ORAL
  Filled 2023-11-29: qty 30

## 2023-11-29 MED ORDER — PANTOPRAZOLE SODIUM 40 MG PO TBEC: 40.0000 mg | DELAYED_RELEASE_TABLET | Freq: Every day | ORAL | 1 refills | Status: DC

## 2023-11-29 NOTE — ED Provider Notes (Signed)
 [facility] Provider Note    Event Date/Time   First MD Initiated Contact with Patient 11/29/23 [phone]     (approximate)   History   No chief complaint on file.   HPI  Ricardo Goodman is a 58 y.o. male   Past medical history of alcohol use, prior gastric ulcer, here with epigastric pain radiating up through his chest, on and off for the last several weeks.  Was here several days ago with the same with a negative workup and ultimately discharged.  Does not have a cardiologist.  Denies GI bleeding.  Denies nausea or vomiting.  Is a daily drinker.  No drug use.  He denies respiratory infectious symptoms or shortness of breath.  No leg swelling or pain.  Independent Historian contributed to assessment above: His wife is at bedside to corroborate information past medical history as above  External Medical Documents Reviewed: Emergency department visit from last week with negative serial troponins      Physical Exam   Triage Vital Signs: ED Triage Vitals [11/29/23 0515]  Encounter Vitals Group     BP      Systolic BP Percentile      Diastolic BP Percentile      Pulse      Resp      Temp      Temp src      SpO2      Weight 180 lb (81.6 kg)     Height 5\' 8"  (1.727 m)     Head Circumference      Peak Flow      Pain Score 8     Pain Loc      Pain Education      Exclude from Growth Chart     Most recent vital signs: Vitals:   11/29/23 0519 11/29/23 0700  BP: 123/81 122/72  Pulse: 65 (!) 59  Resp: 20 20  Temp: (!) 97.5 F (36.4 C)   SpO2: 100% 97%    General: Awake, no distress.  CV:  Good peripheral perfusion.  Resp:  Normal effort.  Abd:  No distention.  Other:  Is some mild left upper quadrant tenderness to palpation epigastric tenderness palpation.  Lungs are clear without focality or wheezing.  Bilateral radial pulses intact and equal.  Appears euvolemic overall.  Hemodynamics appropriate and reassuring   ED Results / Procedures /  Treatments   Labs (all labs ordered are listed, but only abnormal results are displayed) Labs Reviewed  COMPREHENSIVE METABOLIC PANEL WITH GFR - Abnormal; Notable for the following components:      Result Value   Potassium 3.4 (*)    AST 114 (*)    ALT 53 (*)    All other components within normal limits  ETHANOL - Abnormal; Notable for the following components:   Alcohol, Ethyl (B) 239 (*)    All other components within normal limits  CBC WITH DIFFERENTIAL/PLATELET - Abnormal; Notable for the following components:   RBC 3.35 (*)    Hemoglobin 10.0 (*)    HCT 30.6 (*)    All other components within normal limits  LIPASE, BLOOD  TROPONIN I (HIGH SENSITIVITY)  TROPONIN I (HIGH SENSITIVITY)     I ordered and reviewed the above labs they are notable for normal initial troponin.  LFTs with a 2:1 ratio AST to ALT consistent with his alcohol history.  EKG  ED ECG REPORT I, Pilar Jarvis, the attending physician, personally viewed and interpreted this  ECG.   Date: 11/29/2023  EKG Time: 0723  Rate: nl  Rhythm: sinus  Axis: nl  Intervals:none  ST&T Change: no stemi    RADIOLOGY I independently reviewed and interpreted chest x-ray and I see no obvious focality pneumothorax I also reviewed radiologist's formal read.   PROCEDURES:  Critical Care performed: No  Procedures   MEDICATIONS ORDERED IN ED: Medications  morphine (PF) 4 MG/ML injection 4 mg (4 mg Intravenous Given 11/29/23 0638)  alum & mag hydroxide-simeth (MAALOX/MYLANTA) 200-200-20 MG/5ML suspension 30 mL (30 mLs Oral Given 11/29/23 0637)    And  lidocaine (XYLOCAINE) 2 % viscous mouth solution 15 mL (15 mLs Oral Given 11/29/23 0637)  famotidine (PEPCID) tablet 20 mg (20 mg Oral Given 11/29/23 4098)     IMPRESSION / MDM / ASSESSMENT AND PLAN / ED COURSE  I reviewed the triage vital signs and the nursing notes.                                Patient's presentation is most consistent with acute presentation with  potential threat to life or bodily function.  Differential diagnosis includes, but is not limited to, gastritis, gastric ulcer, pancreatitis, considered but less likely biliary pathologies like cholecystitis, surgical abdominal pathology like appendicitis, consider ACS, doubt PE   The patient is on the cardiac monitor to evaluate for evidence of arrhythmia and/or significant heart rate changes.  MDM:    Well-appearing patient with epigastric pain most likely gastritis due to his alcohol use.  Will give GI cocktail.  Consider ACS, though EKG fortunately looks nonischemic.  Follow-up with serial troponins.  I doubt PE given no shortness of breath.  Otherwise benign abdominal exam other than epigastric/left upper quadrant tenderness would rule against other surgical abdominal pathologies like perforated viscus, cholecystitis, appendicitis.  At the time of signout his second troponin is pending.  If this is normal plan will be for discharge with cardiology follow-up and PPI prescription.   Clinical Course as of 11/29/23 0737  Wed Nov 29, 2023  0716 S/o from Dr. Modesto Charon: 58 year old male presenting with epigastric and chest pain in setting of alcohol intoxication.  Workup overall unremarkable --troponin normal, chronic moderate LFT elevation similar to prior, bilirubin normal, no white count no fever.  Pending second troponin as pain woke patient from sleep.  If unremarkable, stable for DC home and plan for PMD follow-up.  Cardiology referral already placed.  TO DO: - f/u rpt trop [MM]  0725 Ecg = sinus rhythm, rate 66, no appreciable ST elevation or depression, T wave inversions in leads III, aVF unchanged from prior comparison.  Normal axis, normal intervals.  No clear evidence of ischemia nor arrhythmia on my read. [MM]    Clinical Course User Index [MM] Marinell Blight, MD     FINAL CLINICAL IMPRESSION(S) / ED DIAGNOSES   Final diagnoses:  Nonspecific chest pain  Epigastric pain   Alcohol use     Rx / DC Orders   ED Discharge Orders          Ordered    Ambulatory referral to Cardiology       Comments: If you have not heard from the Cardiology office within the next 72 hours please call [phone].   11/29/23 0713    pantoprazole (PROTONIX) 40 MG tablet  Daily        11/29/23 0737    Ambulatory referral to Gastroenterology  11/29/23 0737             Note:  This document was prepared using Dragon voice recognition software and may include unintentional dictation errors.    Pilar Jarvis, MD 11/29/23 [phone]

## 2023-11-29 NOTE — ED Provider Notes (Signed)
 Signout from Dr. Modesto Charon.  See updated ED course below. Physical Exam  BP 122/72   Pulse (!) 59   Temp (!) 97.5 F (36.4 C) (Oral)   Resp 20   Ht 5\' 8"  (1.727 m)   Wt 81.6 kg   SpO2 97%   BMI 27.37 kg/m   Physical Exam  Procedures  Procedures  ED Course / MDM   Clinical Course as of 11/29/23 0806  Wed Nov 29, 2023  0716 S/o from Dr. Modesto Charon: 58 year old male presenting with epigastric and chest pain in setting of alcohol intoxication.  Workup overall unremarkable --troponin normal, chronic moderate LFT elevation similar to prior, bilirubin normal, no white count no fever.  Pending second troponin as pain woke patient from sleep.  If unremarkable, stable for DC home and plan for PMD follow-up.  Cardiology referral already placed.  TO DO: - f/u rpt trop [MM]  0725 Ecg = sinus rhythm, rate 66, no appreciable ST elevation or depression, T wave inversions in leads III, aVF unchanged from prior comparison.  Normal axis, normal intervals.  No clear evidence of ischemia nor arrhythmia on my read. [MM]  [id number] Patient reevaluated, is complaining of some ongoing chest discomfort, well-appearing, vital signs stable.  Repeat abdominal exam with very mild tenderness to palpation in epigastrium only.  Discussed possible dyspepsia/GERD as etiology, continue to evaluate possible cardiac etiology as well.  Already received GI cocktail and morphine, will give second round of morphine and Tylenol.  Patient amenable to discharge home with PMD follow-up, PPI, cardiology referral assuming repeat troponin stable. [MM]  0805 Review troponin stable, will proceed with discharge plan per signout which I believe is very reasonable.  Patient amenable to plan.  Starting on PPI, cardiology referral placed, also plan for PMD follow-up..  ED return precautions in place. [MM]    Clinical Course User Index [MM] Marinell Blight, MD   Medical Decision Making Amount and/or Complexity of Data Reviewed Labs:  ordered. Radiology: ordered.  Risk OTC drugs. Prescription drug management.          Marinell Blight, MD 11/29/23 [phone]

## 2023-11-29 NOTE — ED Triage Notes (Signed)
 Pt BIB [facility] for cp that started around 0400. Pt was seen for the same thing 4 days ago. ETOH on board. Pt states the pain is easing off during triage. EMS gave 324mg  aspirin.   EMS: 134/77  70 HR 99% RA

## 2023-11-29 NOTE — Discharge Instructions (Signed)
 Take Protonix as prescribed.  I made a referral to a heart specialist and a GI specialist who can call you for a follow-up appointment.  Thank you for choosing [place] for your health care today!  Please see your primary doctor this week for a follow up appointment.   If you have any new, worsening, or unexpected symptoms call your doctor right away or come back to the emergency department for reevaluation.  It was my pleasure to care for you today.   Daneil Dan Modesto Charon, MD

## 2023-12-28 ENCOUNTER — Ambulatory Visit: Attending: Cardiology | Admitting: Cardiology

## 2024-01-14 NOTE — Progress Notes (Deleted)
 Cardiology Clinic Note   Date: 01/14/2024 ID: Ricardo Goodman, DOB 1966-07-22, MRN [id number]  Primary Cardiologist:  Constancia Delton, MD  Chief Complaint   Ricardo Goodman is a 58 y.o. male who presents to the clinic today for ***  Patient Profile   Ricardo Goodman is followed by Dr. Junnie Olives for the history outlined below.      Past medical history significant for: Chest pain. Echo 03/27/2023: EF 60 to 65%.  No RWMA.  Normal diastolic parameters.  Normal global strain.  Normal RV size/function.  Mildly elevated PA pressure, RVSP 40.8 mmHg.  Mild to moderate MR.  RA pressure 15 mmHg. Coronary CTA 03/27/2023: Coronary calcium score 0 with no evidence of CAD. SVT. Hypertension. GERD. GI bleed. Alcohol use.  In summary, patient was first evaluated by Dr. Junnie Olives on 03/10/2023 for left-sided chest pain and shortness of breath prompting a visit to the ED.  He was evaluated in the ED 5 days prior with an unremarkable workup.  No family history of heart disease.  Patient again presented to the ED on 03/27/2023 with left-sided chest pain that woke him from sleep.  EKG was significant for nonspecific T wave changes different from prior EKG.  He was hypokalemic.  Troponin negative x 2.  He underwent echo which was significant for mildly elevated PA pressure and MR as detailed above.  Coronary CTA showed calcium score of 0 and no evidence of CAD.  It was felt chest pain not cardiac in nature and he was evaluated by GI.  He underwent colonoscopy and had 1 polyp removed and was also found to have internal hemorrhoids.  Patient was seen in the ED on 08/29/2023 with complaints of chest pain and SVT.  Patient had presented to PCP for chest discomfort and found to be in SVT.  EMS was called and able to break SVT by having patient blow out a straw.  Upon arrival to ED he was hypotensive with heart rate in the low 100s.  Troponin 29>> 44>> 50.  Potassium 2.8, magnesium  1.6.  Patient noted chest  discomfort was reproducible with palpation and relieved with opioids and topical analgesics.  During hospital admission hemoglobin was downtrending.  He reported 2 episodes of dark stool prior to arrival.  GI recommended trending hemoglobin in favor of endoscopy in the outpatient setting.  Patient was discharged on 08/31/2023.  Patient was evaluated in the ED again on 4/4 and 4/9 for chest pain with unremarkable workup.     History of Present Illness    Today, patient ***  Recurrent chest pain Echo August 2024 demonstrated normal LV/RV function, no RWMA, normal diastolic parameters, mildly elevated PA pressure, mild to moderate MR.  Coronary CTA showed calcium score of 0 with no evidence of CAD.  Patient with multiple workups in the ED for chest pain since 2024.  He reports*** - Continue amlodipine .  SVT Patient was found to be in SVT at PCPs office in January 2025.  He converted to sinus rhythm with Valsalva.  He was evaluated in the ED and found to have metabolic derangement.  He reports*** -***  Hypertension BP today*** - Continue amlodipine .  Alcohol abuse Patient***  Tobacco abuse***  ROS: All other systems reviewed and are otherwise negative except as noted in History of Present Illness.  EKGs/Labs Reviewed        11/29/2023: ALT 53; AST 114; BUN 10; Creatinine, Ser 0.73; Potassium 3.4; Sodium 142   11/29/2023: Hemoglobin 10.0; WBC 7.0  No results found for requested labs within last 365 days.   03/27/2023: B Natriuretic Peptide 12.9  ***  Risk Assessment/Calculations    {Does this patient have ATRIAL FIBRILLATION?[phone]} No BP recorded.  {Refresh Note OR Click here to enter BP  :1}***        Physical Exam    VS:  There were no vitals taken for this visit. , BMI There is no height or weight on file to calculate BMI.  GEN: Well nourished, well developed, in no acute distress. Neck: No JVD or carotid bruits. Cardiac: *** RRR. No murmurs. No rubs or gallops.    Respiratory:  Respirations regular and unlabored. Clear to auscultation without rales, wheezing or rhonchi. GI: Soft, nontender, nondistended. Extremities: Radials/DP/PT 2+ and equal bilaterally. No clubbing or cyanosis. No edema ***  Skin: Warm and dry, no rash. Neuro: Strength intact.  Assessment & Plan   ***  Disposition: ***     {Are you ordering a CV Procedure (e.g. stress test, cath, DCCV, TEE, etc)?   Press F2        :[id number]}   Signed, Lonell Rives. Braya Habermehl, DNP, NP-C

## 2024-01-16 ENCOUNTER — Ambulatory Visit: Admitting: Student

## 2024-01-16 NOTE — Progress Notes (Unsigned)
 Cardiology Clinic Note   Date: 01/18/2024 ID: Ricardo Goodman, DOB Sep 10, 1965, MRN [id number]  Primary Cardiologist:  Constancia Delton, MD  Chief Complaint   Ricardo Goodman is a 58 y.o. male who presents to the clinic today for evaluation of chest pain.   Patient Profile   Ricardo Goodman is followed by Dr. Junnie Olives for the history outlined below.      Past medical history significant for: Chest pain. Echo 03/27/2023: EF 60 to 65%.  No RWMA.  Normal diastolic parameters.  Normal global strain.  Normal RV size/function.  Mildly elevated PA pressure, RVSP 40.8 mmHg.  Mild to moderate MR.  RA pressure 15 mmHg. Coronary CTA 03/27/2023: Coronary calcium score 0 with no evidence of CAD. SVT. Hypertension. GERD. GI bleed. Alcohol use.  In summary, patient was first evaluated by Dr. Junnie Olives on 03/10/2023 for left-sided chest pain and shortness of breath prompting a visit to the ED.  He was evaluated in the ED 5 days prior with an unremarkable workup.  No family history of heart disease.  Patient again presented to the ED on 03/27/2023 with left-sided chest pain that woke him from sleep.  EKG was significant for nonspecific T wave changes different from prior EKG.  He was hypokalemic.  Troponin negative x 2.  He underwent echo which was significant for mildly elevated PA pressure and MR as detailed above.  Coronary CTA showed calcium score of 0 and no evidence of CAD.  It was felt chest pain not cardiac in nature and he was evaluated by GI.  He underwent colonoscopy and had 1 polyp removed and was also found to have internal hemorrhoids.  Patient was seen in the ED on 08/29/2023 with complaints of chest pain and SVT.  Patient had presented to PCP for chest discomfort and found to be in SVT.  EMS was called and able to break SVT by having patient blow out a straw.  Upon arrival to ED he was hypotensive with heart rate in the low 100s.  Troponin 29>> 44>> 50.  Potassium 2.8, magnesium  1.6.   Patient noted chest discomfort was reproducible with palpation and relieved with opioids and topical analgesics.  During hospital admission hemoglobin was downtrending.  He reported 2 episodes of dark stool prior to arrival.  GI recommended trending hemoglobin in favor of endoscopy in the outpatient setting.  Patient was discharged on 08/31/2023.  Patient was evaluated in the ED again on 4/4 and 4/9 for chest pain with unremarkable workup.     History of Present Illness    Today, patient reports continued episodes of chest pain unchanged from previous. He has been seen in the ED 2 times in April for chest pain. He was also evaluated by PCP in January for chest pain and found to be in SVT. Patient reports left sided chest pain occurs with and without exertion and may be slightly worse. He is able to walk 1 mile (about 30 minutes) daily. His biggest concern is palpitations and dizziness. He reports feeling like his heart rate increases and he becomes lightheaded and dizzy with quick position changes. He will have some mild dizziness on occasion with slower position changes. Dizziness is typically associated with palpitations. Symptoms occur several times a week. He reports his wife checks his BP but he does not know the readings. He smokes 1 cigarette a day. He drinks a six pack of beer a night. He denies illicit drug use.     ROS: All other  systems reviewed and are otherwise negative except as noted in History of Present Illness.  EKGs/Labs Reviewed    EKG Interpretation Date/Time:  Thursday Jan 18 2024 08:35:57 EDT Ventricular Rate:  71 PR Interval:  156 QRS Duration:  76 QT Interval:  398 QTC Calculation: 432 R Axis:   12  Text Interpretation: Normal sinus rhythm T wave abnormality, consider inferolateral ischemia When compared with ECG of 29-Nov-2023 07:23, PREVIOUS ECG IS PRESENT Confirmed by Morey Ar [phone]) on 01/18/2024 8:46:42 AM   11/29/2023: ALT 53; AST 114; BUN 10;  Creatinine, Ser 0.73; Potassium 3.4; Sodium 142   11/29/2023: Hemoglobin 10.0; WBC 7.0   No results found for requested labs within last 365 days.   03/27/2023: B Natriuretic Peptide 12.9       Physical Exam    VS:  BP (!) 126/92 (BP Location: Left Arm, Patient Position: Sitting, Cuff Size: Normal)   Pulse 75   Ht 5\' 8"  (1.727 m)   Wt 179 lb (81.2 kg)   SpO2 98%   BMI 27.22 kg/m  , BMI Body mass index is 27.22 kg/m.  Orthostatic VS for the past 24 hrs (Last 3 readings):  BP- Lying Pulse- Lying BP- Sitting Pulse- Sitting BP- Standing at 0 minutes Pulse- Standing at 0 minutes BP- Standing at 3 minutes Pulse- Standing at 3 minutes  01/18/24 0900 132/86 65 (!) 142/102 73 (!) 128/92 76 120/88 80     GEN: Well nourished, well developed, in no acute distress. Neck: No JVD or carotid bruits. Cardiac:  RRR. No murmurs. No rubs or gallops.   Respiratory:  Respirations regular and unlabored. Clear to auscultation without rales, wheezing or rhonchi. GI: Soft, nontender, nondistended. Extremities: Radials/DP/PT 2+ and equal bilaterally. No clubbing or cyanosis. No edema,  Skin: Warm and dry, no rash. Neuro: Strength intact.  Assessment & Plan   Recurrent chest pain Echo August 2024 demonstrated normal LV/RV function, no RWMA, normal diastolic parameters, mildly elevated PA pressure, mild to moderate MR.  Coronary CTA showed calcium score of 0 with no evidence of CAD.  Patient with multiple workups in the ED for chest pain since 2024.  He reports left sided chest pain with and without exertion that is the same as previous but sometimes a little worse. He walks 1 mile daily. Discussed prior testing with patient.  - Add isosorbide 15 mg daily.  - Continue amlodipine .  SVT/dizziness.  Patient was found to be in SVT at PCPs office in January 2025.  He converted to sinus rhythm with Valsalva.  He was evaluated in the ED and found to have metabolic derangement.  He reports palpitations with  associated dizziness several times a week. If he feels his heart rate is elevated he will blow through a straw with improvement of symptoms. Orthostatic vitals were negative for big drop in BP but patient was dizzy with sit to stand and standing for 3 minutes.  - 14 day Zio.  - BMP today.  - Educated on improving hydration and slow position changes.   Hypertension BP today 126/92. No report of headaches. He states his wife checks his BP at home but he does not know the readings. He reports dizziness with fast position changes and associated with palpitations.  - Add isosorbide as above.  - Continue amlodipine .  Alcohol abuse Patient reports drinking a six pack of beer a night. Discussed alcohol contributing to symptoms of dizziness and palpitations.  - Recommended abstaining from alcohol use.   Tobacco abuse  Patient reports smoking 1 cigarette a day.  - Encouraged complete cessation.   Disposition: 14 day Zio. BMP today. Isosorbide 15 mg daily. Return in 2-3 months or sooner as needed.          Signed, Lonell Rives. Hassel Uphoff, DNP, NP-C

## 2024-01-18 ENCOUNTER — Encounter: Payer: Self-pay | Admitting: Student

## 2024-01-18 ENCOUNTER — Ambulatory Visit

## 2024-01-18 ENCOUNTER — Ambulatory Visit: Attending: Student | Admitting: Student

## 2024-01-18 VITALS — BP 126/92 | HR 75 | Ht 68.0 in | Wt 179.0 lb

## 2024-01-18 DIAGNOSIS — R002 Palpitations: Secondary | ICD-10-CM

## 2024-01-18 DIAGNOSIS — R079 Chest pain, unspecified: Secondary | ICD-10-CM

## 2024-01-18 DIAGNOSIS — I471 Supraventricular tachycardia, unspecified: Secondary | ICD-10-CM

## 2024-01-18 DIAGNOSIS — I10 Essential (primary) hypertension: Secondary | ICD-10-CM

## 2024-01-18 DIAGNOSIS — Z79899 Other long term (current) drug therapy: Secondary | ICD-10-CM

## 2024-01-18 DIAGNOSIS — R42 Dizziness and giddiness: Secondary | ICD-10-CM | POA: Diagnosis present

## 2024-01-18 MED ORDER — ISOSORBIDE DINITRATE 30 MG PO TABS: 15.0000 mg | ORAL_TABLET | Freq: Every day | ORAL | 3 refills | Status: DC

## 2024-01-18 MED ORDER — ISOSORBIDE MONONITRATE ER 30 MG PO TB24: 15.0000 mg | ORAL_TABLET | Freq: Every day | ORAL | 3 refills | Status: DC

## 2024-01-18 NOTE — Patient Instructions (Signed)
 Medication Instructions:  Your physician recommends the following medication changes.   START TAKING: Isosorbide  Mononitrate (Imdur ) 15 mg (1/2 of 30 mg tablet) once daily  *If you need a refill on your cardiac medications before your next appointment, please call your pharmacy*  Lab Work: Your provider would like for you to have following labs drawn today BMet.   If you have labs (blood work) drawn today and your tests are completely normal, you will receive your results only by: MyChart Message (if you have MyChart) OR A paper copy in the mail If you have any lab test that is abnormal or we need to change your treatment, we will call you to review the results.  Testing/Procedures: Your physician has recommended that you wear a Zio monitor.   This monitor is a medical device that records the heart's electrical activity. Doctors most often use these monitors to diagnose arrhythmias. Arrhythmias are problems with the speed or rhythm of the heartbeat. The monitor is a small device applied to your chest. You can wear one while you do your normal daily activities. While wearing this monitor if you have any symptoms to push the button and record what you felt. Once you have worn this monitor for the period of time provider prescribed (Usually 14 days), you will return the monitor device in the postage paid box. Once it is returned they will download the data collected and provide us  with a report which the provider will then review and we will call you with those results. Important tips:  Avoid showering during the first 24 hours of wearing the monitor. Avoid excessive sweating to help maximize wear time. Do not submerge the device, no hot tubs, and no swimming pools. Keep any lotions or oils away from the patch. After 24 hours you may shower with the patch on. Take brief showers with your back facing the shower head.  Do not remove patch once it has been placed because that will interrupt data  and decrease adhesive wear time. Push the button when you have any symptoms and write down what you were feeling. Once you have completed wearing your monitor, remove and place into box which has postage paid and place in your outgoing mailbox.  If for some reason you have misplaced your box then call our office and we can provide another box and/or mail it off for you.   Follow-Up: At [facility], you and your health needs are our priority.  As part of our continuing mission to provide you with exceptional heart care, our providers are all part of one team.  This team includes your primary Cardiologist (physician) and Advanced Practice Providers or APPs (Physician Assistants and Nurse Practitioners) who all work together to provide you with the care you need, when you need it.  Your next appointment:   2 - 3 month(s)  Provider:   Constancia Delton, MD or Morey Ar, NP    We recommend signing up for the patient portal called "MyChart".  Sign up information is provided on this After Visit Summary.  MyChart is used to connect with patients for Virtual Visits (Telemedicine).  Patients are able to view lab/test results, encounter notes, upcoming appointments, etc.  Non-urgent messages can be sent to your provider as well.   To learn more about what you can do with MyChart, go to [email or web address].

## 2024-01-19 ENCOUNTER — Ambulatory Visit: Payer: Self-pay | Admitting: Student

## 2024-01-19 LAB — BASIC METABOLIC PANEL WITH GFR
BUN/Creatinine Ratio: 12 (ref 9–20)
BUN: 9 mg/dL (ref 6–24)
CO2: 23 mmol/L (ref 20–29)
Calcium: 10.2 mg/dL (ref 8.7–10.2)
Chloride: 102 mmol/L (ref 96–106)
Creatinine, Ser: 0.78 mg/dL (ref 0.76–1.27)
Glucose: 88 mg/dL (ref 70–99)
Potassium: 4 mmol/L (ref 3.5–5.2)
Sodium: 143 mmol/L (ref 134–144)
eGFR: 103 mL/min/{1.73_m2} (ref >59)

## 2024-02-10 ENCOUNTER — Other Ambulatory Visit: Payer: Self-pay

## 2024-02-10 ENCOUNTER — Encounter: Payer: Self-pay | Admitting: Intensive Care

## 2024-02-10 ENCOUNTER — Emergency Department
Admission: EM | Admit: 2024-02-10 | Discharge: 2024-02-10 | Disposition: A | Discharge status: Home / Self Care | Attending: Emergency Medicine | Admitting: Emergency Medicine

## 2024-02-10 ENCOUNTER — Emergency Department

## 2024-02-10 DIAGNOSIS — R0789 Other chest pain: Secondary | ICD-10-CM | POA: Diagnosis present

## 2024-02-10 DIAGNOSIS — E876 Hypokalemia: Secondary | ICD-10-CM | POA: Diagnosis not present

## 2024-02-10 DIAGNOSIS — R079 Chest pain, unspecified: Secondary | ICD-10-CM

## 2024-02-10 DIAGNOSIS — I10 Essential (primary) hypertension: Secondary | ICD-10-CM | POA: Insufficient documentation

## 2024-02-10 DIAGNOSIS — D649 Anemia, unspecified: Secondary | ICD-10-CM | POA: Diagnosis not present

## 2024-02-10 DIAGNOSIS — K625 Hemorrhage of anus and rectum: Secondary | ICD-10-CM | POA: Diagnosis not present

## 2024-02-10 LAB — TYPE AND SCREEN
ABO/RH(D): O POS
Antibody Screen: NEGATIVE

## 2024-02-10 LAB — BASIC METABOLIC PANEL WITH GFR
Anion gap: 12 (ref 5–15)
BUN: 8 mg/dL (ref 6–20)
CO2: 23 mmol/L (ref 22–32)
Calcium: 9.1 mg/dL (ref 8.9–10.3)
Chloride: 108 mmol/L (ref 98–111)
Creatinine, Ser: 0.68 mg/dL (ref 0.61–1.24)
GFR, Estimated: >60 mL/min (ref >60)
Glucose, Bld: 75 mg/dL (ref 70–99)
Potassium: 3 mmol/L — ABNORMAL LOW (ref 3.5–5.1)
Sodium: 143 mmol/L (ref 135–145)

## 2024-02-10 LAB — D-DIMER, QUANTITATIVE: D-Dimer, Quant: 0.94 ug{FEU}/mL — ABNORMAL HIGH (ref 0.00–0.50)

## 2024-02-10 LAB — CBC
HCT: 29.4 % — ABNORMAL LOW (ref 39.0–52.0)
Hemoglobin: 9.4 g/dL — ABNORMAL LOW (ref 13.0–17.0)
MCH: 28.3 pg (ref 26.0–34.0)
MCHC: 32 g/dL (ref 30.0–36.0)
MCV: 88.6 fL (ref 80.0–100.0)
Platelets: 152 10*3/uL (ref 150–400)
RBC: 3.32 MIL/uL — ABNORMAL LOW (ref 4.22–5.81)
RDW: 16 % — ABNORMAL HIGH (ref 11.5–15.5)
WBC: 4.4 10*3/uL (ref 4.0–10.5)
nRBC: 0 % (ref 0.0–0.2)

## 2024-02-10 LAB — TROPONIN I (HIGH SENSITIVITY)
Troponin I (High Sensitivity): 13 ng/L (ref <18)
Troponin I (High Sensitivity): 11 ng/L (ref <18)

## 2024-02-10 MED ORDER — POTASSIUM CHLORIDE CRYS ER 20 MEQ PO TBCR
40.0000 meq | EXTENDED_RELEASE_TABLET | Freq: Once | ORAL | Status: AC
  Administered 2024-02-10: 40 meq via ORAL
  Filled 2024-02-10: qty 2

## 2024-02-10 MED ORDER — PANTOPRAZOLE SODIUM 40 MG PO TBEC: 40.0000 mg | DELAYED_RELEASE_TABLET | Freq: Two times a day (BID) | ORAL | 0 refills | Status: DC

## 2024-02-10 MED ORDER — LIDOCAINE 5 % EX PTCH
1.0000 | MEDICATED_PATCH | CUTANEOUS | Status: DC
  Administered 2024-02-10: 1 via TRANSDERMAL
  Filled 2024-02-10: qty 1

## 2024-02-10 MED ORDER — IOHEXOL 350 MG/ML SOLN
75.0000 mL | Freq: Once | INTRAVENOUS | Status: AC | PRN
  Administered 2024-02-10: 75 mL via INTRAVENOUS

## 2024-02-10 MED ORDER — OXYCODONE-ACETAMINOPHEN 5-325 MG PO TABS
1.0000 | ORAL_TABLET | Freq: Once | ORAL | Status: AC
  Administered 2024-02-10: 1 via ORAL
  Filled 2024-02-10: qty 1

## 2024-02-10 NOTE — ED Provider Notes (Signed)
 [facility] Provider Note    Event Date/Time   First MD Initiated Contact with Patient 02/10/24 0940     (approximate)   History   Chief Complaint Chest Pain   HPI  Ricardo Goodman is a 58 y.o. male with past medical history of hypertension, PUD, DVT, and alcohol abuse who presents to the ED complaining of chest pain.  Patient reports that he has been dealing with pressure that he describes as a elephant sitting on my chest to the left side of his chest for about the past hour.  Pain came on at rest and does not seem to be exacerbated or alleviated by anything in particular.  He describes a dry cough as well as some mild difficulty breathing, has not noticed any pain or swelling in his legs.  He does state that he got a call from his PCP a couple of days ago that his blood counts were low and that he needed to come to the hospital.  He states that he had some bright red blood in his stool earlier this week that has since resolved, denies any dark black stool.     Physical Exam   Triage Vital Signs: ED Triage Vitals  Encounter Vitals Group     BP 02/10/24 0930 (!) 146/106     Girls Systolic BP Percentile --      Girls Diastolic BP Percentile --      Boys Systolic BP Percentile --      Boys Diastolic BP Percentile --      Pulse Rate 02/10/24 0930 81     Resp 02/10/24 0930 16     Temp 02/10/24 0930 98.2 F (36.8 C)     Temp Source 02/10/24 0930 Oral     SpO2 02/10/24 0930 97 %     Weight 02/10/24 0927 180 lb (81.6 kg)     Height 02/10/24 0927 5' 8 (1.727 m)     Head Circumference --      Peak Flow --      Pain Score 02/10/24 0927 8     Pain Loc --      Pain Education --      Exclude from Growth Chart --     Most recent vital signs: Vitals:   02/10/24 0930 02/10/24 1200  BP: (!) 146/106 (!) 152/99  Pulse: 81 60  Resp: 16 18  Temp: 98.2 F (36.8 C)   SpO2: 97% 99%    Constitutional: Alert and oriented. Eyes: Conjunctivae are  normal. Head: Atraumatic. Nose: No congestion/rhinnorhea. Mouth/Throat: Mucous membranes are moist.  Cardiovascular: Normal rate, regular rhythm. Grossly normal heart sounds.  2+ radial pulses bilaterally. Respiratory: Normal respiratory effort.  No retractions. Lungs CTAB.  Left chest wall tenderness to palpation noted. Gastrointestinal: Soft and nontender. No distention. Musculoskeletal: No lower extremity tenderness nor edema.  Neurologic:  Normal speech and language. No gross focal neurologic deficits are appreciated.    ED Results / Procedures / Treatments   Labs (all labs ordered are listed, but only abnormal results are displayed) Labs Reviewed  BASIC METABOLIC PANEL WITH GFR - Abnormal; Notable for the following components:      Result Value   Potassium 3.0 (*)    All other components within normal limits  CBC - Abnormal; Notable for the following components:   RBC 3.32 (*)    Hemoglobin 9.4 (*)    HCT 29.4 (*)    RDW 16.0 (*)    All  other components within normal limits  D-DIMER, QUANTITATIVE - Abnormal; Notable for the following components:   D-Dimer, Quant 0.94 (*)    All other components within normal limits  TYPE AND SCREEN  TROPONIN I (HIGH SENSITIVITY)  TROPONIN I (HIGH SENSITIVITY)     EKG  ED ECG REPORT I, Carlin Palin, the attending physician, personally viewed and interpreted this ECG.   Date: 02/10/2024  EKG Time: 9:27  Rate: 77  Rhythm: normal sinus rhythm  Axis: Normal  Intervals:none  ST&T Change: Inferior T wave inversions, similar to previous  RADIOLOGY Chest x-ray reviewed and interpreted by me with left lower lobe infiltrate.  PROCEDURES:  Critical Care performed: No  Procedures   MEDICATIONS ORDERED IN ED: Medications  lidocaine  (LIDODERM ) 5 % 1 patch (1 patch Transdermal Patch Applied 02/10/24 1034)  oxyCODONE -acetaminophen  (PERCOCET/ROXICET) 5-325 MG per tablet 1 tablet (1 tablet Oral Given 02/10/24 1034)  iohexol   (OMNIPAQUE ) 350 MG/ML injection 75 mL (75 mLs Intravenous Contrast Given 02/10/24 1220)  potassium chloride  SA (KLOR-CON  M) CR tablet 40 mEq (40 mEq Oral Given 02/10/24 1230)     IMPRESSION / MDM / ASSESSMENT AND PLAN / ED COURSE  I reviewed the triage vital signs and the nursing notes.                              58 y.o. male with past medical history of hypertension, DVT, PUD, and alcohol abuse who presents to the ED complaining of left-sided chest discomfort for the past hour.  Patient's presentation is most consistent with acute presentation with potential threat to life or bodily function.  Differential diagnosis includes, but is not limited to, ACS, PE, pneumonia, pneumothorax, musculoskeletal pain, GERD, anxiety, anemia, electrolyte abnormality, AKI.  Patient nontoxic-appearing and in no acute distress, vital signs are unremarkable.  EKG shows normal sinus rhythm with inferior T wave inversions, similar compared to previous.  Symptoms seem atypical for ACS given they are reproducible with palpation, will check 2 sets of troponin as well as D-dimer.  Chest x-ray with possible left lower lobe pneumonia, which could be the etiology of his symptoms given his recent cough.  Labs do show hemoglobin of 9.4, which is comparable to his last visit a couple of months ago.  Patient denies any ongoing bleeding at this time and I doubt significant GI bleed, but he would benefit from GI follow-up if remainder of workup is unremarkable.  D-dimer elevated but CTA chest is negative for PE or other acute finding, does not demonstrate possible pneumonia seen on chest x-ray.  2 sets of troponin are within normal limits and chest pain has resolved on reassessment, low suspicion for ACS.  Labs with anemia that appears stable compared to previous, no significant leukocytosis, or AKI, mild hypokalemia was repleted.  Patient appropriate for discharge home with outpatient GI follow-up, will restart on PPI and he was  counseled to work on eliminating alcohol consumption.  He was counseled to return to the ED for new or worsening symptoms, patient agrees with plan.      FINAL CLINICAL IMPRESSION(S) / ED DIAGNOSES   Final diagnoses:  Nonspecific chest pain  Rectal bleeding     Rx / DC Orders   ED Discharge Orders          Ordered    pantoprazole  (PROTONIX ) 40 MG tablet  2 times daily before meals        02/10/24 1339  Note:  This document was prepared using Dragon voice recognition software and may include unintentional dictation errors.   Willo Dunnings, MD 02/10/24 1341

## 2024-02-10 NOTE — ED Notes (Signed)
 EDP at bedside

## 2024-02-10 NOTE — ED Notes (Signed)
 Came to draw blood, pt in xray.

## 2024-02-10 NOTE — ED Notes (Signed)
 Tried IV, will do u/s IV for bloodwork.

## 2024-02-10 NOTE — ED Triage Notes (Signed)
 Patient c/o chest pain since this AM. Describes as elephant on his chest. Radiation to left arm  Reports he was also told his blood count is low after having labs drawn with [facility]

## 2024-02-23 DIAGNOSIS — R002 Palpitations: Secondary | ICD-10-CM | POA: Diagnosis not present

## 2024-03-23 NOTE — Progress Notes (Unsigned)
 Cardiology Clinic Note   Date: 03/28/2024 ID: NGHIA MCENTEE, DOB [birth year], MRN [id number]  Primary Cardiologist:  Redell Cave, MD  Chief Complaint   Ricardo Goodman is a 58 y.o. male who presents to the clinic today for routine follow up.   Patient Profile   Ricardo Goodman is followed by Dr. Gollan for the history outlined below.       Past medical history significant for: Chest pain. Echo 03/27/2023: EF 60 to 65%.  No RWMA.  Normal diastolic parameters.  Normal global strain.  Normal RV size/function.  Mildly elevated PA pressure, RVSP 40.8 mmHg.  Mild to moderate MR.  RA pressure 15 mmHg. Coronary CTA 03/27/2023: Coronary calcium score 0 with no evidence of CAD. SVT. 14-day Zio 02/19/2024: HR 52 to 161 bpm, average 80 bpm.  Predominantly sinus rhythm.  1 run of SVT lasting 4 beats max rate 135 bpm.  No A-fib/flutter, no sustained arrhythmias. Hypertension. GERD. GI bleed. Alcohol use.  In summary, patient was first evaluated by Dr. Cave on 03/10/2023 for left-sided chest pain and shortness of breath prompting a visit to the ED.  He was evaluated in the ED 5 days prior with an unremarkable workup.  No family history of heart disease.  Patient again presented to the ED on 03/27/2023 with left-sided chest pain that woke him from sleep.  EKG was significant for nonspecific T wave changes different from prior EKG.  He was hypokalemic.  Troponin negative x 2.  He underwent echo which was significant for mildly elevated PA pressure and MR as detailed above.  Coronary CTA showed calcium score of 0 and no evidence of CAD.  It was felt chest pain not cardiac in nature and he was evaluated by GI.  He underwent colonoscopy and had 1 polyp removed and was also found to have internal hemorrhoids.  Patient was seen in the ED on 08/29/2023 with complaints of chest pain and SVT.  Patient had presented to PCP for chest discomfort and found to be in SVT.  EMS was called and able to break SVT  by having patient blow out a straw.  Upon arrival to ED he was hypotensive with heart rate in the low 100s.  Troponin 29>> 44>> 50.  Potassium 2.8, magnesium  1.6.  Patient noted chest discomfort was reproducible with palpation and relieved with opioids and topical analgesics.  During hospital admission hemoglobin was downtrending.  He reported 2 episodes of dark stool prior to arrival.  GI recommended trending hemoglobin in favor of endoscopy in the outpatient setting.  Patient was discharged on 08/31/2023.  Patient was evaluated in the ED again on 4/4 and 4/9 for chest pain with unremarkable workup.   Patient was last seen in the office by me on 01/18/2024 for hospital follow-up.  Patient reported continued left-sided chest pain with and without exertion.  He reported being able to walk 1 mile in about 30 minutes daily.  Patient was most concerned with palpitations and dizziness.  He described feeling like his heart rate increases and he was becoming lightheaded and dizzy with quick position changes.  Dizziness was typically associated with palpitations and occurring several times a week.  Isosorbide  was added for recurrent chest pain.  14-day ZIO showed 1 run of SVT.  Patient presented to the ED on 02/10/2024 with complaints of chest pain described as an elephant sitting on his chest radiating to his left arm.  He also reported being told by PCP that his blood count  was low and he should present to the ED.  He reported bright red blood in his stool and no dark black stool.  Initial labs: WBC 4.4, hemoglobin 9.4, hematocrit 29.4, sodium 143, potassium 3, BUN 8, creatinine 0.68.  D-dimer 0.94.  Troponin negative x 2.  CTA negative for PE.  Potassium was repleted with p.o. dose of Klor-Con .  PPI was restarted and patient was counseled on eliminating alcohol consumption.  He was discharged to follow-up with GI as an outpatient.     History of Present Illness    Today, patient reports an episode of his chronic  chest pain this morning that lasted 5 minutes. Pain was no different than past pain. He feels the isosorbide  has helped the chest pain and he is having fewer episodes and they are not lasting as long. He denies shortness of breath, DOE, lower extremity edema, orthopnea, PND or palpitations. His wife checks his BP at home but he does not know the readings. He reports his wife states his BP is high. He denies headaches but reports some mild lightheadedness at times. He has been cut back on his drinking and is consuming 2 beers a night. He quit smoking completely. He has been walking for exercise 30 minutes twice a week.     ROS: All other systems reviewed and are otherwise negative except as noted in History of Present Illness.  EKGs/Labs Reviewed    EKG Interpretation Date/Time:  Thursday March 28 2024 13:30:59 EDT Ventricular Rate:  65 PR Interval:  152 QRS Duration:  76 QT Interval:  440 QTC Calculation: 457 R Axis:   7  Text Interpretation: Normal sinus rhythm Nonspecific T wave abnormality When compared with ECG of 10-Feb-2024 09:27, Nonspecific T wave abnormality has replaced inverted T waves in Inferior leads   Confirmed by Loistine Sober [phone]) on 03/28/2024 1:34:30 PM   11/29/2023: ALT 53; AST 114 02/10/2024: BUN 8; Creatinine, Ser 0.68; Potassium 3.0; Sodium 143   02/10/2024: Hemoglobin 9.4; WBC 4.4    Risk Assessment/Calculations      HYPERTENSION CONTROL Vitals:   03/28/24 1321 03/28/24 1323 03/28/24 1414  BP: (!) 138/94 (!) 140/96 (!) 148/90    The patient's blood pressure is elevated above target today.  In order to address the patient's elevated BP: A current anti-hypertensive medication was adjusted today.; A new medication was prescribed today.           Physical Exam    VS:  BP (!) 148/90 (BP Location: Left Arm, Patient Position: Sitting, Cuff Size: Normal)   Pulse 65   Ht 5' 8 (1.727 m)   Wt 176 lb (79.8 kg)   SpO2 99%   BMI 26.76 kg/m  , BMI  Body mass index is 26.76 kg/m.  GEN: Well nourished, well developed, in no acute distress. Neck: No JVD or carotid bruits. Cardiac: . RRR. SABRA No murmur. No rubs or gallops.   Respiratory:  Respirations regular and unlabored. Clear to auscultation without rales, wheezing or rhonchi. GI: Soft, nontender, nondistended. Extremities: Radials/DP/PT 2+ and equal bilaterally. No clubbing or cyanosis. No edema.  Skin: Warm and dry, no rash. Neuro: Strength intact.  Assessment & Plan   Recurrent chest pain Echo August 2024 demonstrated normal LV/RV function, no RWMA, normal diastolic parameters, mildly elevated PA pressure, mild to moderate MR.  Coronary CTA showed calcium score of 0 with no evidence of CAD.  Patient with multiple workups in the ED for chest pain since 2024.  Patient reports chest  pain has improved since starting isosorbide . He is having fewer episodes and they are not lasting as long. He did have an episode this morning that lasted about 5 minutes. EKG without acute changes. No further workup indicated at this time.  - Increase isosorbide  to 30 mg daily.  - Continue amlodipine .   SVT/dizziness.  Patient was found to be in SVT at PCPs office in January 2025.  He converted to sinus rhythm with Valsalva.  He was evaluated in the ED and found to have metabolic derangement.  14-day ZIO demonstrated 1 run of SVT lasting 4 beats max rate 135 bpm.  Patient denies recent palpitations. He denies dizziness. He will have some mild lightheadedness at times. RRR on exam today.  - Continue to monitor.    Hypertension BP today 140/96 on intake and 148/90 on my recheck. His wife checks his BP at home. He does not know the readings but she tells him it is high. He denies headaches. He reports occasional lightheadedness.   - Continue amlodipine , isosorbide  (as above). - Add losartan  25 mg daily.  - BMP in 2 weeks.    Alcohol abuse Patient reports cutting his drinking down to 2 beer a night. He  plans on continuing to reduce use.  - Recommended abstaining from alcohol use.    Tobacco abuse Patient reports complete cessation of smoking. He is congratulated on his efforts.  - Encouraged continued cessation.   Disposition: Increase isosorbide  to 30 mg daily. Add losartan  25 mg daily. BMP in 2 weeks. Return in 3 months or sooner as needed.          Signed, Barnie HERO. Capucine Tryon, DNP, NP-C

## 2024-03-28 ENCOUNTER — Encounter: Payer: Self-pay | Admitting: Student

## 2024-03-28 ENCOUNTER — Ambulatory Visit: Payer: MEDICAID | Attending: Student | Admitting: Student

## 2024-03-28 VITALS — BP 148/90 | HR 65 | Ht 68.0 in | Wt 176.0 lb

## 2024-03-28 DIAGNOSIS — I10 Essential (primary) hypertension: Secondary | ICD-10-CM | POA: Diagnosis present

## 2024-03-28 DIAGNOSIS — R079 Chest pain, unspecified: Secondary | ICD-10-CM | POA: Diagnosis present

## 2024-03-28 DIAGNOSIS — R42 Dizziness and giddiness: Secondary | ICD-10-CM | POA: Diagnosis present

## 2024-03-28 DIAGNOSIS — Z79899 Other long term (current) drug therapy: Secondary | ICD-10-CM | POA: Insufficient documentation

## 2024-03-28 DIAGNOSIS — F17201 Nicotine dependence, unspecified, in remission: Secondary | ICD-10-CM | POA: Diagnosis present

## 2024-03-28 DIAGNOSIS — F1010 Alcohol abuse, uncomplicated: Secondary | ICD-10-CM | POA: Diagnosis present

## 2024-03-28 DIAGNOSIS — R002 Palpitations: Secondary | ICD-10-CM | POA: Diagnosis present

## 2024-03-28 DIAGNOSIS — I471 Supraventricular tachycardia, unspecified: Secondary | ICD-10-CM | POA: Diagnosis present

## 2024-03-28 MED ORDER — LOSARTAN POTASSIUM 25 MG PO TABS: 25.0000 mg | ORAL_TABLET | Freq: Every day | ORAL | 3 refills | Status: AC

## 2024-03-28 MED ORDER — ISOSORBIDE MONONITRATE ER 30 MG PO TB24: 30.0000 mg | ORAL_TABLET | Freq: Every day | ORAL | 3 refills | Status: AC

## 2024-03-28 NOTE — Patient Instructions (Addendum)
 Medication Instructions:   Your physician recommends the following medication changes.  START TAKING: Losartan  (COZAAR ) 25 mg by mouth daily  INCREASE: Isosorbide  from 15 to 30 mg by mouth daily  *If you need a refill on your cardiac medications before your next appointment, please call your pharmacy*  Lab Work:  Your provider would like for you to return in 2 weeks to have the following labs drawn: BMET.   Please go to [facility] [address] ([facility] [address], [place] [address] You do not need an appointment.  They are open from 8 am- 4:30 pm.  Lunch from 1:00 pm- 2:00 pm   If you have labs (blood work) drawn today and your tests are completely normal, you will receive your results only by: MyChart Message (if you have MyChart) OR A paper copy in the mail If you have any lab test that is abnormal or we need to change your treatment, we will call you to review the results.  Testing/Procedures:  No test ordered today   Follow-Up: At [facility], you and your health needs are our priority.  As part of our continuing mission to provide you with exceptional heart care, our providers are all part of one team.  This team includes your primary Cardiologist (physician) and Advanced Practice Providers or APPs (Physician Assistants and Nurse Practitioners) who all work together to provide you with the care you need, when you need it.  Your next appointment:   3 month(s)  Provider:   You may see Redell Cave, MD or one of the following Advanced Practice Providers on your designated Care Team:    Barnie Hila, NP   We recommend signing up for the patient portal called MyChart.  Sign up information is provided on this After Visit Summary.  MyChart is used to connect with patients for Virtual Visits (Telemedicine).  Patients are able to view lab/test results, encounter notes, upcoming appointments, etc.  Non-urgent messages can be sent  to your provider as well.   To learn more about what you can do with MyChart, go to [email or web address].

## 2024-06-26 ENCOUNTER — Encounter: Payer: Self-pay | Admitting: Emergency Medicine

## 2024-06-26 ENCOUNTER — Emergency Department: Payer: MEDICAID

## 2024-06-26 ENCOUNTER — Other Ambulatory Visit: Payer: Self-pay

## 2024-06-26 DIAGNOSIS — R079 Chest pain, unspecified: Secondary | ICD-10-CM | POA: Diagnosis present

## 2024-06-26 DIAGNOSIS — R0789 Other chest pain: Secondary | ICD-10-CM | POA: Insufficient documentation

## 2024-06-26 DIAGNOSIS — R1013 Epigastric pain: Secondary | ICD-10-CM | POA: Insufficient documentation

## 2024-06-26 LAB — CBC
HCT: 29.5 % — ABNORMAL LOW (ref 39.0–52.0)
Hemoglobin: 9.3 g/dL — ABNORMAL LOW (ref 13.0–17.0)
MCH: 27.4 pg (ref 26.0–34.0)
MCHC: 31.5 g/dL (ref 30.0–36.0)
MCV: 87 fL (ref 80.0–100.0)
Platelets: 144 K/uL — ABNORMAL LOW (ref 150–400)
RBC: 3.39 MIL/uL — ABNORMAL LOW (ref 4.22–5.81)
RDW: 18.1 % — ABNORMAL HIGH (ref 11.5–15.5)
WBC: 4.2 K/uL (ref 4.0–10.5)
nRBC: 0 % (ref 0.0–0.2)

## 2024-06-26 LAB — BASIC METABOLIC PANEL WITH GFR
Anion gap: 16 — ABNORMAL HIGH (ref 5–15)
BUN: 9 mg/dL (ref 6–20)
CO2: 25 mmol/L (ref 22–32)
Calcium: 9.9 mg/dL (ref 8.9–10.3)
Chloride: 104 mmol/L (ref 98–111)
Creatinine, Ser: 0.57 mg/dL — ABNORMAL LOW (ref 0.61–1.24)
GFR, Estimated: >60 mL/min (ref >60)
Glucose, Bld: 92 mg/dL (ref 70–99)
Potassium: 3.2 mmol/L — ABNORMAL LOW (ref 3.5–5.1)
Sodium: 145 mmol/L (ref 135–145)

## 2024-06-26 LAB — TROPONIN I (HIGH SENSITIVITY): Troponin I (High Sensitivity): 13 ng/L (ref <18)

## 2024-06-26 NOTE — ED Triage Notes (Signed)
 Patient ambulatory to triage with steady gait, without difficulty or distress noted; pt reports left sided CP x 2-3 days accomp by [facility]; no meds taken PTA

## 2024-06-27 ENCOUNTER — Emergency Department
Admission: EM | Admit: 2024-06-27 | Discharge: 2024-06-27 | Disposition: A | Discharge status: Home / Self Care | Payer: MEDICAID | Attending: Emergency Medicine | Admitting: Emergency Medicine

## 2024-06-27 DIAGNOSIS — R0789 Other chest pain: Secondary | ICD-10-CM

## 2024-06-27 DIAGNOSIS — R1013 Epigastric pain: Secondary | ICD-10-CM

## 2024-06-27 MED ORDER — ACETAMINOPHEN 500 MG PO TABS
1000.0000 mg | ORAL_TABLET | Freq: Once | ORAL | Status: AC
  Administered 2024-06-27: 1000 mg via ORAL
  Filled 2024-06-27: qty 2

## 2024-06-27 MED ORDER — PANTOPRAZOLE SODIUM 40 MG PO TBEC
40.0000 mg | DELAYED_RELEASE_TABLET | ORAL | Status: AC
  Administered 2024-06-27: 40 mg via ORAL
  Filled 2024-06-27: qty 1

## 2024-06-27 MED ORDER — SUCRALFATE 1 G PO TABS: 1.0000 g | ORAL_TABLET | Freq: Four times a day (QID) | ORAL | 1 refills | Status: AC | PRN

## 2024-06-27 MED ORDER — SUCRALFATE 1 G PO TABS
1.0000 g | ORAL_TABLET | Freq: Once | ORAL | Status: AC
  Administered 2024-06-27: 1 g via ORAL
  Filled 2024-06-27: qty 1

## 2024-06-27 MED ORDER — ALUM & MAG HYDROXIDE-SIMETH 200-200-20 MG/5ML PO SUSP
30.0000 mL | Freq: Once | ORAL | Status: AC
  Administered 2024-06-27: 30 mL via ORAL
  Filled 2024-06-27: qty 30

## 2024-06-27 MED ORDER — PANTOPRAZOLE SODIUM 40 MG PO TBEC: 40.0000 mg | DELAYED_RELEASE_TABLET | Freq: Two times a day (BID) | ORAL | 1 refills | Status: AC

## 2024-06-27 NOTE — Discharge Instructions (Signed)
 Please take the prescribed medications as written and follow-up with your regular doctor at the next available opportunity.  Read through the included information about acid reflux.  Given your history of stomach ulcers, it is important you take a medication such as pantoprazole  to protect your stomach and your esophagus.  Avoid alcohol and spicy and fatty foods.  Read through the included information.  Follow-up with your regular doctor and return to the emergency department if you develop new or worsening symptoms that concern you.

## 2024-06-27 NOTE — ED Notes (Signed)
 Pt tolerated chocolate ice cream and 2 cups of water for a PO challenge

## 2024-06-27 NOTE — ED Provider Notes (Signed)
 [facility] Provider Note    Event Date/Time   First MD Initiated Contact with Patient 06/27/24 0040     (approximate)   History   Chest Pain   HPI Ricardo Goodman is a 58 y.o. male who presents for vague and intermittent discomfort in his chest going down to the upper part of his abdomen for the last several days.  It comes and goes and tends to come back after he eats.  He does not take any medication for acid reflux but said that he used to and in fact he had surgery for a ruptured stomach ulcer in the past.  He is not having any trouble breathing and is not having sharp chest pain.  He has no nausea or vomiting.  No lower abdominal pain.     Physical Exam   Triage Vital Signs: ED Triage Vitals  Encounter Vitals Group     BP 06/26/24 2322 (!) 128/91     Girls Systolic BP Percentile --      Girls Diastolic BP Percentile --      Boys Systolic BP Percentile --      Boys Diastolic BP Percentile --      Pulse Rate 06/26/24 2322 74     Resp 06/26/24 2322 20     Temp 06/26/24 2322 97.8 F (36.6 C)     Temp Source 06/26/24 2322 Oral     SpO2 06/26/24 2322 98 %     Weight 06/26/24 2314 81.6 kg (180 lb)     Height 06/26/24 2314 1.727 m (5' 8)     Head Circumference --      Peak Flow --      Pain Score 06/26/24 2314 10     Pain Loc --      Pain Education --      Exclude from Growth Chart --     Most recent vital signs: Vitals:   06/27/24 0205 06/27/24 0206  BP:  (!) 149/94  Pulse: 66 76  Resp: 13 17  Temp:    SpO2: 99% 100%    General: Awake, no distress.  Well-appearing.  Conversant and pleasant. CV:  Good peripheral perfusion.  Normal heart sounds, regular rate and rhythm. Resp:  Normal effort. Speaking easily and comfortably, no accessory muscle usage nor intercostal retractions.  Lungs are clear to auscultation bilaterally. Abd:  No distention.  No tenderness to palpation of the abdomen including in the epigastrium and right upper  quadrant with negative Murphy sign.  No guarding.  No pulsatile abdominal mass and no auscultated abdominal bruit   ED Results / Procedures / Treatments   Labs (all labs ordered are listed, but only abnormal results are displayed) Labs Reviewed  CBC - Abnormal; Notable for the following components:      Result Value   RBC 3.39 (*)    Hemoglobin 9.3 (*)    HCT 29.5 (*)    RDW 18.1 (*)    Platelets 144 (*)    All other components within normal limits  BASIC METABOLIC PANEL WITH GFR - Abnormal; Notable for the following components:   Potassium 3.2 (*)    Creatinine, Ser 0.57 (*)    Anion gap 16 (*)    All other components within normal limits  TROPONIN I (HIGH SENSITIVITY)     EKG  ED ECG REPORT I, Darleene Dome, the attending physician, personally viewed and interpreted this ECG.  Date: 06/26/2024 EKG Time: 23: 19 Rate: 67  Rhythm: normal sinus rhythm QRS Axis: normal Intervals: normal ST/T Wave abnormalities: Non-specific ST segment / T-wave changes, but no clear evidence of acute ischemia. Narrative Interpretation: no definitive evidence of acute ischemia; does not meet STEMI criteria.    RADIOLOGY I independently viewed and interpreted the patient's two-view chest x-ray and I see no evidence of pneumonia or pneumomediastinum or pneumothorax.  I also read the radiologist's report, which confirmed no acute findings.   PROCEDURES:  Critical Care performed: No  Procedures    IMPRESSION / MDM / ASSESSMENT AND PLAN / ED COURSE  I reviewed the triage vital signs and the nursing notes.                              Differential diagnosis includes, but is not limited to, ACS, pancreatitis, biliary colic, acid reflux, dyspepsia not otherwise specified.  Patient's presentation is most consistent with acute presentation with potential threat to life or bodily function.  Labs/studies ordered: Two-view chest x-ray, EKG, high-sensitivity troponin, BMP,  CBC  Interventions/Medications given:  Medications  acetaminophen  (TYLENOL ) tablet 1,000 mg (has no administration in time range)  alum & mag hydroxide-simeth (MAALOX/MYLANTA) 200-200-20 MG/5ML suspension 30 mL (has no administration in time range)  sucralfate (CARAFATE) tablet 1 g (has no administration in time range)  pantoprazole  (PROTONIX ) EC tablet 40 mg (has no administration in time range)    (Note:  hospital course my include additional interventions and/or labs/studies not listed above.)   Reassuring EKG and labs with no significant abnormalities.  Very minimally elevated anion gap which is of little clinical significance given the overall presentation, no leukocytosis, negative high-sensitivity troponin.  Patient's symptoms have been going on for 2 to 3 days and there is no indication for repeat high-sensitivity troponin with no history of cardiac disease and low risk.  The patient's history of reflux and stomach ulcer requiring surgery strongly suggest dyspepsia and acid reflux.  I am providing the medications listed above and he is tolerating oral intake without difficulty.  I strongly encouraged him to go back to taking a PPI and given prescriptions as listed below.  He is stable with no evidence of an acute or emergent medical condition and appropriate for discharge and outpatient follow-up.  I gave my usual return precautions.         FINAL CLINICAL IMPRESSION(S) / ED DIAGNOSES   Final diagnoses:  Dyspepsia  Atypical chest pain     Rx / DC Orders   ED Discharge Orders          Ordered    sucralfate (CARAFATE) 1 g tablet  4 times daily PRN        06/27/24 0208    pantoprazole  (PROTONIX ) 40 MG tablet  2 times daily before meals        06/27/24 0208             Note:  This document was prepared using Dragon voice recognition software and may include unintentional dictation errors.   Gordan Huxley, MD 06/27/24 [phone]

## 2024-06-28 ENCOUNTER — Ambulatory Visit: Payer: MEDICAID | Attending: Cardiology | Admitting: Cardiology

## 2024-07-01 ENCOUNTER — Encounter: Payer: Self-pay | Admitting: Cardiology
